# Patient Record
Sex: Male | Born: 1975 | Hispanic: Yes | Marital: Married | Smoking: Never smoker
Health system: Southern US, Community
[De-identification: ages and names within clinical notes are randomized; demographics above are authoritative.]

## PROBLEM LIST (undated history)

## (undated) DIAGNOSIS — K8020 Calculus of gallbladder without cholecystitis without obstruction: Secondary | ICD-10-CM

---

## 2011-09-20 ENCOUNTER — Emergency Department: Payer: Self-pay

## 2019-03-31 ENCOUNTER — Emergency Department: Payer: Medicaid Other

## 2019-03-31 ENCOUNTER — Observation Stay
Admission: EM | Admit: 2019-03-31 | Discharge: 2019-04-01 | Disposition: A | Discharge status: Home / Self Care | Payer: Medicaid Other | Attending: Surgery | Admitting: Surgery

## 2019-03-31 ENCOUNTER — Encounter
Admission: EM | Disposition: A | Discharge status: Home / Self Care | Payer: Self-pay | Source: Home / Self Care | Attending: Emergency Medicine

## 2019-03-31 ENCOUNTER — Observation Stay: Payer: Medicaid Other | Admitting: Anesthesiology

## 2019-03-31 ENCOUNTER — Other Ambulatory Visit: Payer: Self-pay

## 2019-03-31 ENCOUNTER — Encounter: Payer: Self-pay | Admitting: Emergency Medicine

## 2019-03-31 DIAGNOSIS — K658 Other peritonitis: Secondary | ICD-10-CM | POA: Insufficient documentation

## 2019-03-31 DIAGNOSIS — R1013 Epigastric pain: Secondary | ICD-10-CM

## 2019-03-31 DIAGNOSIS — K810 Acute cholecystitis: Secondary | ICD-10-CM | POA: Diagnosis present

## 2019-03-31 DIAGNOSIS — K660 Peritoneal adhesions (postprocedural) (postinfection): Secondary | ICD-10-CM | POA: Insufficient documentation

## 2019-03-31 DIAGNOSIS — Z1159 Encounter for screening for other viral diseases: Secondary | ICD-10-CM | POA: Diagnosis not present

## 2019-03-31 DIAGNOSIS — K8012 Calculus of gallbladder with acute and chronic cholecystitis without obstruction: Principal | ICD-10-CM | POA: Insufficient documentation

## 2019-03-31 DIAGNOSIS — R112 Nausea with vomiting, unspecified: Secondary | ICD-10-CM

## 2019-03-31 DIAGNOSIS — I10 Essential (primary) hypertension: Secondary | ICD-10-CM

## 2019-03-31 HISTORY — DX: Calculus of gallbladder without cholecystitis without obstruction: K80.20

## 2019-03-31 HISTORY — PX: CHOLECYSTECTOMY: SHX55

## 2019-03-31 LAB — COMPREHENSIVE METABOLIC PANEL
ALT: 41 U/L (ref 0–44)
AST: 29 U/L (ref 15–41)
Albumin: 4.4 g/dL (ref 3.5–5.0)
Alkaline Phosphatase: 117 U/L (ref 38–126)
Anion gap: 8 (ref 5–15)
BUN: 13 mg/dL (ref 6–20)
CO2: 28 mmol/L (ref 22–32)
Calcium: 8.9 mg/dL (ref 8.9–10.3)
Chloride: 104 mmol/L (ref 98–111)
Creatinine, Ser: 1.27 mg/dL — ABNORMAL HIGH (ref 0.61–1.24)
GFR calc Af Amer: >60 mL/min (ref >60)
GFR calc non Af Amer: >60 mL/min (ref >60)
Glucose, Bld: 129 mg/dL — ABNORMAL HIGH (ref 70–99)
Potassium: 3.5 mmol/L (ref 3.5–5.1)
Sodium: 140 mmol/L (ref 135–145)
Total Bilirubin: 0.7 mg/dL (ref 0.3–1.2)
Total Protein: 7.6 g/dL (ref 6.5–8.1)

## 2019-03-31 LAB — URINALYSIS, COMPLETE (UACMP) WITH MICROSCOPIC
Bacteria, UA: NONE SEEN
Bilirubin Urine: NEGATIVE
Glucose, UA: NEGATIVE mg/dL
Hgb urine dipstick: NEGATIVE
Ketones, ur: NEGATIVE mg/dL
Leukocytes,Ua: NEGATIVE
Nitrite: NEGATIVE
Protein, ur: NEGATIVE mg/dL
Specific Gravity, Urine: 1.015 (ref 1.005–1.030)
Squamous Epithelial / HPF: NONE SEEN (ref 0–5)
pH: 7 (ref 5.0–8.0)

## 2019-03-31 LAB — SARS CORONAVIRUS 2 BY RT PCR (HOSPITAL ORDER, PERFORMED IN ~~LOC~~ HOSPITAL LAB): SARS Coronavirus 2: NEGATIVE

## 2019-03-31 LAB — CBC
HCT: 46.5 % (ref 39.0–52.0)
Hemoglobin: 16.1 g/dL (ref 13.0–17.0)
MCH: 28.6 pg (ref 26.0–34.0)
MCHC: 34.6 g/dL (ref 30.0–36.0)
MCV: 82.7 fL (ref 80.0–100.0)
Platelets: 181 10*3/uL (ref 150–400)
RBC: 5.62 MIL/uL (ref 4.22–5.81)
RDW: 13.1 % (ref 11.5–15.5)
WBC: 9.4 10*3/uL (ref 4.0–10.5)
nRBC: 0 % (ref 0.0–0.2)

## 2019-03-31 LAB — LIPASE, BLOOD: Lipase: 45 U/L (ref 11–51)

## 2019-03-31 LAB — SURGICAL PCR SCREEN
MRSA, PCR: NEGATIVE
Staphylococcus aureus: NEGATIVE

## 2019-03-31 SURGERY — LAPAROSCOPIC CHOLECYSTECTOMY
Anesthesia: General

## 2019-03-31 MED ORDER — TRAMADOL HCL 50 MG PO TABS: 50.0000 mg | ORAL_TABLET | Freq: Four times a day (QID) | ORAL | Status: DC | PRN

## 2019-03-31 MED ORDER — ONDANSETRON HCL 4 MG/2ML IJ SOLN: 4.0000 mg | Freq: Four times a day (QID) | INTRAMUSCULAR | Status: DC | PRN

## 2019-03-31 MED ORDER — MEPERIDINE HCL 50 MG/ML IJ SOLN: 6.2500 mg | INTRAMUSCULAR | Status: DC | PRN

## 2019-03-31 MED ORDER — ACETAMINOPHEN 160 MG/5ML PO SOLN
325.0000 mg | ORAL | Status: DC | PRN
  Filled 2019-03-31: qty 10.2

## 2019-03-31 MED ORDER — DOCUSATE SODIUM 100 MG PO CAPS: 100.0000 mg | ORAL_CAPSULE | Freq: Two times a day (BID) | ORAL | Status: DC | PRN

## 2019-03-31 MED ORDER — OXYCODONE HCL 5 MG PO TABS: 5.0000 mg | ORAL_TABLET | Freq: Once | ORAL | Status: DC | PRN

## 2019-03-31 MED ORDER — MIDAZOLAM HCL 2 MG/2ML IJ SOLN
INTRAMUSCULAR | Status: DC | PRN
  Administered 2019-03-31: 2 mg via INTRAVENOUS

## 2019-03-31 MED ORDER — ACETAMINOPHEN 650 MG RE SUPP: 650.0000 mg | Freq: Four times a day (QID) | RECTAL | Status: DC | PRN

## 2019-03-31 MED ORDER — MORPHINE SULFATE (PF) 4 MG/ML IV SOLN
4.0000 mg | Freq: Once | INTRAVENOUS | Status: AC
  Administered 2019-03-31: 06:00:00 4 mg via INTRAVENOUS
  Filled 2019-03-31: qty 1

## 2019-03-31 MED ORDER — ONDANSETRON HCL 4 MG/2ML IJ SOLN
INTRAMUSCULAR | Status: AC
  Filled 2019-03-31: qty 2

## 2019-03-31 MED ORDER — ACETAMINOPHEN 325 MG PO TABS: 325.0000 mg | ORAL_TABLET | ORAL | Status: DC | PRN

## 2019-03-31 MED ORDER — MUPIROCIN 2 % EX OINT
1.0000 "application " | TOPICAL_OINTMENT | Freq: Two times a day (BID) | CUTANEOUS | Status: DC
  Administered 2019-03-31: 1 via NASAL
  Filled 2019-03-31: qty 22

## 2019-03-31 MED ORDER — ROCURONIUM BROMIDE 100 MG/10ML IV SOLN
INTRAVENOUS | Status: DC | PRN
  Administered 2019-03-31: 20 mg via INTRAVENOUS
  Administered 2019-03-31: 40 mg via INTRAVENOUS
  Administered 2019-03-31: 5 mg via INTRAVENOUS
  Administered 2019-03-31: 20 mg via INTRAVENOUS
  Administered 2019-03-31: 10 mg via INTRAVENOUS

## 2019-03-31 MED ORDER — LACTATED RINGERS IV SOLN
INTRAVENOUS | Status: DC
  Administered 2019-03-31 – 2019-04-01 (×3): via INTRAVENOUS

## 2019-03-31 MED ORDER — FENTANYL CITRATE (PF) 100 MCG/2ML IJ SOLN
INTRAMUSCULAR | Status: AC
  Filled 2019-03-31: qty 2

## 2019-03-31 MED ORDER — ROCURONIUM BROMIDE 50 MG/5ML IV SOLN
INTRAVENOUS | Status: AC
  Filled 2019-03-31: qty 1

## 2019-03-31 MED ORDER — ONDANSETRON 4 MG PO TBDP: 4.0000 mg | ORAL_TABLET | Freq: Four times a day (QID) | ORAL | Status: DC | PRN

## 2019-03-31 MED ORDER — ONDANSETRON HCL 4 MG/2ML IJ SOLN
4.0000 mg | INTRAMUSCULAR | Status: AC
  Administered 2019-03-31: 4 mg via INTRAVENOUS
  Filled 2019-03-31: qty 2

## 2019-03-31 MED ORDER — SODIUM CHLORIDE 0.9 % IV SOLN
2.0000 g | Freq: Every day | INTRAVENOUS | Status: DC
  Administered 2019-03-31: 2 g via INTRAVENOUS
  Filled 2019-03-31: qty 2
  Filled 2019-03-31: qty 20

## 2019-03-31 MED ORDER — LIDOCAINE HCL (PF) 2 % IJ SOLN
INTRAMUSCULAR | Status: AC
  Filled 2019-03-31: qty 10

## 2019-03-31 MED ORDER — ENOXAPARIN SODIUM 40 MG/0.4ML ~~LOC~~ SOLN
40.0000 mg | SUBCUTANEOUS | Status: DC
  Administered 2019-04-01: 40 mg via SUBCUTANEOUS
  Filled 2019-03-31: qty 0.4

## 2019-03-31 MED ORDER — MIDAZOLAM HCL 2 MG/2ML IJ SOLN
INTRAMUSCULAR | Status: AC
  Filled 2019-03-31: qty 2

## 2019-03-31 MED ORDER — FENTANYL CITRATE (PF) 100 MCG/2ML IJ SOLN: 25.0000 ug | INTRAMUSCULAR | Status: DC | PRN

## 2019-03-31 MED ORDER — PROPOFOL 10 MG/ML IV BOLUS
INTRAVENOUS | Status: DC | PRN
  Administered 2019-03-31: 200 mg via INTRAVENOUS

## 2019-03-31 MED ORDER — HYDROMORPHONE HCL 1 MG/ML IJ SOLN
1.0000 mg | INTRAMUSCULAR | Status: DC | PRN
  Administered 2019-03-31: 1 mg via INTRAVENOUS
  Filled 2019-03-31: qty 1

## 2019-03-31 MED ORDER — FENTANYL CITRATE (PF) 100 MCG/2ML IJ SOLN
INTRAMUSCULAR | Status: DC | PRN
  Administered 2019-03-31 (×2): 50 ug via INTRAVENOUS
  Administered 2019-03-31: 100 ug via INTRAVENOUS

## 2019-03-31 MED ORDER — IBUPROFEN 400 MG PO TABS: 600.0000 mg | ORAL_TABLET | Freq: Four times a day (QID) | ORAL | Status: DC | PRN

## 2019-03-31 MED ORDER — ACETAMINOPHEN 10 MG/ML IV SOLN
INTRAVENOUS | Status: AC
  Filled 2019-03-31: qty 100

## 2019-03-31 MED ORDER — HYDROCODONE-ACETAMINOPHEN 5-325 MG PO TABS
1.0000 | ORAL_TABLET | ORAL | Status: DC | PRN
  Administered 2019-03-31 – 2019-04-01 (×3): 2 via ORAL
  Filled 2019-03-31 (×3): qty 2

## 2019-03-31 MED ORDER — ACETAMINOPHEN 10 MG/ML IV SOLN
INTRAVENOUS | Status: DC | PRN
  Administered 2019-03-31: 1000 mg via INTRAVENOUS

## 2019-03-31 MED ORDER — ACETAMINOPHEN 10 MG/ML IV SOLN: 1000.0000 mg | Freq: Once | INTRAVENOUS | Status: DC | PRN

## 2019-03-31 MED ORDER — PROPOFOL 10 MG/ML IV BOLUS
INTRAVENOUS | Status: AC
  Filled 2019-03-31: qty 20

## 2019-03-31 MED ORDER — PHENYLEPHRINE HCL (PRESSORS) 10 MG/ML IV SOLN
INTRAVENOUS | Status: DC | PRN
  Administered 2019-03-31 (×2): 100 ug via INTRAVENOUS
  Administered 2019-03-31: 200 ug via INTRAVENOUS
  Administered 2019-03-31 (×3): 100 ug via INTRAVENOUS

## 2019-03-31 MED ORDER — ACETAMINOPHEN 325 MG PO TABS: 650.0000 mg | ORAL_TABLET | Freq: Four times a day (QID) | ORAL | Status: DC | PRN

## 2019-03-31 MED ORDER — SUGAMMADEX SODIUM 500 MG/5ML IV SOLN
INTRAVENOUS | Status: DC | PRN
  Administered 2019-03-31: 220 mg via INTRAVENOUS

## 2019-03-31 MED ORDER — HEMOSTATIC AGENTS (NO CHARGE) OPTIME
TOPICAL | Status: DC | PRN
  Administered 2019-03-31: 1 via TOPICAL

## 2019-03-31 MED ORDER — LIDOCAINE-EPINEPHRINE (PF) 1 %-1:200000 IJ SOLN
INTRAMUSCULAR | Status: DC | PRN
  Administered 2019-03-31: 30 mL

## 2019-03-31 MED ORDER — DEXAMETHASONE SODIUM PHOSPHATE 10 MG/ML IJ SOLN
INTRAMUSCULAR | Status: DC | PRN
  Administered 2019-03-31: 10 mg via INTRAVENOUS

## 2019-03-31 MED ORDER — OXYCODONE HCL 5 MG/5ML PO SOLN: 5.0000 mg | Freq: Once | ORAL | Status: DC | PRN

## 2019-03-31 MED ORDER — SUGAMMADEX SODIUM 500 MG/5ML IV SOLN
INTRAVENOUS | Status: AC
  Filled 2019-03-31: qty 5

## 2019-03-31 MED ORDER — ONDANSETRON HCL 4 MG/2ML IJ SOLN
INTRAMUSCULAR | Status: DC | PRN
  Administered 2019-03-31: 4 mg via INTRAVENOUS

## 2019-03-31 MED ORDER — PANTOPRAZOLE SODIUM 40 MG IV SOLR
40.0000 mg | Freq: Every day | INTRAVENOUS | Status: DC
  Administered 2019-03-31: 40 mg via INTRAVENOUS
  Filled 2019-03-31: qty 40

## 2019-03-31 MED ORDER — DEXAMETHASONE SODIUM PHOSPHATE 10 MG/ML IJ SOLN
INTRAMUSCULAR | Status: AC
  Filled 2019-03-31: qty 1

## 2019-03-31 MED ORDER — MORPHINE SULFATE (PF) 2 MG/ML IV SOLN
2.0000 mg | INTRAVENOUS | Status: DC | PRN
  Administered 2019-03-31: 2 mg via INTRAVENOUS
  Filled 2019-03-31: qty 1

## 2019-03-31 MED ORDER — LIDOCAINE HCL (CARDIAC) PF 100 MG/5ML IV SOSY
PREFILLED_SYRINGE | INTRAVENOUS | Status: DC | PRN
  Administered 2019-03-31: 80 mg via INTRAVENOUS

## 2019-03-31 MED ORDER — SODIUM CHLORIDE 0.9 % IV SOLN
INTRAVENOUS | Status: DC | PRN
  Administered 2019-03-31: 30 mL via INTRAVENOUS

## 2019-03-31 MED ORDER — PROMETHAZINE HCL 25 MG/ML IJ SOLN: 6.2500 mg | INTRAMUSCULAR | Status: DC | PRN

## 2019-03-31 SURGICAL SUPPLY — 59 items
ANCHOR TIS RET SYS 235ML (MISCELLANEOUS) ×1 IMPLANT
APPLICATOR ARISTA FLEXITIP XL (MISCELLANEOUS) ×2 IMPLANT
APPLIER CLIP 5 13 M/L LIGAMAX5 (MISCELLANEOUS) ×6
BLADE SURG SZ11 CARB STEEL (BLADE) ×3 IMPLANT
CANISTER SUCT 1200ML W/VALVE (MISCELLANEOUS) ×3 IMPLANT
CHLORAPREP W/TINT 26 (MISCELLANEOUS) ×3 IMPLANT
CHOLANGIOGRAM CATH TAUT (CATHETERS) IMPLANT
CLIP APPLIE 5 13 M/L LIGAMAX5 (MISCELLANEOUS) ×1 IMPLANT
COVER WAND RF STERILE (DRAPES) ×3 IMPLANT
DECANTER SPIKE VIAL GLASS SM (MISCELLANEOUS) ×6 IMPLANT
DEFOGGER SCOPE WARMER CLEARIFY (MISCELLANEOUS) ×3 IMPLANT
DERMABOND ADVANCED (GAUZE/BANDAGES/DRESSINGS) ×2
DERMABOND ADVANCED .7 DNX12 (GAUZE/BANDAGES/DRESSINGS) ×1 IMPLANT
DISSECTOR BLUNT TIP ENDO 5MM (MISCELLANEOUS) IMPLANT
DISSECTOR KITTNER STICK (MISCELLANEOUS) IMPLANT
DISSECTORS/KITTNER STICK (MISCELLANEOUS)
DRAPE 3/4 80X56 (DRAPES) IMPLANT
DRAPE C-ARM XRAY 36X54 (DRAPES) IMPLANT
ELECT CAUTERY BLADE 6.4 (BLADE) ×3 IMPLANT
ELECT REM PT RETURN 9FT ADLT (ELECTROSURGICAL) ×3
ELECTRODE REM PT RTRN 9FT ADLT (ELECTROSURGICAL) ×1 IMPLANT
GLOVE BIOGEL PI IND STRL 7.0 (GLOVE) ×1 IMPLANT
GLOVE BIOGEL PI INDICATOR 7.0 (GLOVE) ×4
GLOVE SURG SYN 6.5 ES PF (GLOVE) ×6 IMPLANT
GLOVE SURG SYN 6.5 PF PI (GLOVE) ×1 IMPLANT
GOWN STRL REUS W/ TWL LRG LVL3 (GOWN DISPOSABLE) ×3 IMPLANT
GOWN STRL REUS W/TWL LRG LVL3 (GOWN DISPOSABLE) ×10
GRASPER SUT TROCAR 14GX15 (MISCELLANEOUS) ×3 IMPLANT
HEMOSTAT ARISTA ABSORB 1G (HEMOSTASIS) ×2 IMPLANT
HEMOSTAT SURGICEL 2X3 (HEMOSTASIS) ×2 IMPLANT
IRRIGATION STRYKERFLOW (MISCELLANEOUS) IMPLANT
IRRIGATOR STRYKERFLOW (MISCELLANEOUS) ×3
IV CATH ANGIO 12GX3 LT BLUE (NEEDLE) IMPLANT
IV NS 1000ML (IV SOLUTION) ×2
IV NS 1000ML BAXH (IV SOLUTION) IMPLANT
JACKSON PRATT 10 (INSTRUMENTS) IMPLANT
L-HOOK LAP DISP 36CM (ELECTROSURGICAL) ×3
LABEL OR SOLS (LABEL) ×3 IMPLANT
LHOOK LAP DISP 36CM (ELECTROSURGICAL) ×1 IMPLANT
NEEDLE HYPO 22GX1.5 SAFETY (NEEDLE) ×3 IMPLANT
PACK LAP CHOLECYSTECTOMY (MISCELLANEOUS) ×3 IMPLANT
PENCIL ELECTRO HAND CTR (MISCELLANEOUS) ×3 IMPLANT
PORT ACCESS TROCAR AIRSEAL 5 (TROCAR) ×3 IMPLANT
SCISSORS METZENBAUM CVD 33 (INSTRUMENTS) ×3 IMPLANT
SET TRI-LUMEN FLTR TB AIRSEAL (TUBING) ×3 IMPLANT
SLEEVE ENDOPATH XCEL 5M (ENDOMECHANICALS) ×3 IMPLANT
SPONGE LAP 18X18 RF (DISPOSABLE) IMPLANT
STOPCOCK 4 WAY LG BORE MALE ST (IV SETS) IMPLANT
SUT MNCRL 4-0 (SUTURE) ×4
SUT MNCRL 4-0 27XMFL (SUTURE) ×2
SUT VIC AB 3-0 SH 27 (SUTURE) ×2
SUT VIC AB 3-0 SH 27X BRD (SUTURE) IMPLANT
SUT VICRYL 0 AB UR-6 (SUTURE) ×6 IMPLANT
SUT VICRYL 0 UR6 27IN ABS (SUTURE) ×2 IMPLANT
SUTURE MNCRL 4-0 27XMF (SUTURE) ×1 IMPLANT
SYR 20CC LL (SYRINGE) ×3 IMPLANT
TROCAR XCEL BLUNT TIP 100MML (ENDOMECHANICALS) ×3 IMPLANT
TROCAR XCEL NON-BLD 5MMX100MML (ENDOMECHANICALS) ×3 IMPLANT
WATER STERILE IRR 1000ML POUR (IV SOLUTION) ×3 IMPLANT

## 2019-03-31 NOTE — ED Notes (Signed)
Report to amber, rn.  

## 2019-03-31 NOTE — ED Notes (Signed)
Pt provided with urinal

## 2019-03-31 NOTE — Anesthesia Preprocedure Evaluation (Addendum)
Anesthesia Evaluation  Patient identified by MRN, date of birth, ID band Patient awake    Reviewed: Allergy & Precautions, H&P , NPO status , reviewed documented beta blocker date and time   Airway Mallampati: II   Neck ROM: full    Dental  (+) Teeth Intact   Pulmonary    Pulmonary exam normal        Cardiovascular  Rate:Bradycardia  SB on EKG   Neuro/Psych    GI/Hepatic   Endo/Other    Renal/GU      Musculoskeletal   Abdominal   Peds  Hematology   Anesthesia Other Findings Past Medical History: No date: Gallstones History reviewed. No pertinent surgical history. BMI    Body Mass Index: 30.81 kg/m     Reproductive/Obstetrics                            Anesthesia Physical Anesthesia Plan  ASA: II  Anesthesia Plan: General   Post-op Pain Management:    Induction: Intravenous  PONV Risk Score and Plan: Ondansetron and Treatment may vary due to age or medical condition  Airway Management Planned: Oral ETT  Additional Equipment:   Intra-op Plan:   Post-operative Plan: Extubation in OR  Informed Consent: I have reviewed the patients History and Physical, chart, labs and discussed the procedure including the risks, benefits and alternatives for the proposed anesthesia with the patient or authorized representative who has indicated his/her understanding and acceptance.     Dental Advisory Given  Plan Discussed with: CRNA  Anesthesia Plan Comments:         Anesthesia Quick Evaluation

## 2019-03-31 NOTE — Anesthesia Post-op Follow-up Note (Signed)
Anesthesia QCDR form completed.        

## 2019-03-31 NOTE — Transfer of Care (Signed)
Immediate Anesthesia Transfer of Care Note  Patient: Navy Belay  Procedure(s) Performed: LAPAROSCOPIC CHOLECYSTECTOMY (N/A )  Patient Location: PACU  Anesthesia Type:General  Level of Consciousness: awake, alert  and oriented  Airway & Oxygen Therapy: Patient Spontanous Breathing and Patient connected to face mask oxygen  Post-op Assessment: Report given to RN and Post -op Vital signs reviewed and stable  Post vital signs: Reviewed and stable  Last Vitals:  Vitals Value Taken Time  BP 107/48 03/31/19 1727  Temp    Pulse 60 03/31/19 1727  Resp 12 03/31/19 1727  SpO2 100 % 03/31/19 1727  Vitals shown include unvalidated device data.  Last Pain:  Vitals:   03/31/19 1727  TempSrc:   PainSc: 0-No pain         Complications: No apparent anesthesia complications

## 2019-03-31 NOTE — H&P (Signed)
Subjective:   CC: acute cholecystitis  HPI:  Steve Hunt is a 43 y.o. male who is consulted by [facility] for evaluation of above cc.  Symptoms were first noted several years ago. Pain is intermittent, often at night, located in epigastric/RUQ region.  Resolves on its own.  Antacids do not help.  Associated with occasional N/V, exacerbated by possibly spicy and/or fatty food.  This pain episode started last night, did not get any better so came to ED.  Has not been seen for this issue before.     Past Medical History: none reported  Past Surgical History: none reported   Family History: reviewed and not relevant to CC  Social History:  reports that he has never smoked. He has never used smokeless tobacco. No history on file for alcohol and drug.  Current Medications: none reported   Allergies:  Allergies as of 03/31/2019  . (No Known Allergies)    ROS:  General: Denies weight loss, weight gain, fatigue, fevers, chills, and night sweats. Eyes: Denies blurry vision, double vision, eye pain, itchy eyes, and tearing. Ears: Denies hearing loss, earache, and ringing in ears. Nose: Denies sinus pain, congestion, infections, runny nose, and nosebleeds. Mouth/throat: Denies hoarseness, sore throat, bleeding gums, and difficulty swallowing. Heart: Denies chest pain, palpitations, racing heart, irregular heartbeat, leg pain or swelling, and decreased activity tolerance. Respiratory: Denies breathing difficulty, shortness of breath, wheezing, cough, and sputum. GI: Denies change in appetite, constipation, diarrhea, and blood in stool. GU: Denies difficulty urinating, pain with urinating, urgency, frequency, blood in urine. Musculoskeletal: Denies joint stiffness, pain, swelling, muscle weakness. Skin: Denies rash, itching, mass, tumors, sores, and boils Neurologic: Denies headache, fainting, dizziness, seizures, numbness, and tingling. Psychiatric: Denies depression, anxiety,  difficulty sleeping, and memory loss. Endocrine: Denies heat or cold intolerance, and increased thirst or urination. Blood/lymph: Denies easy bruising, easy bruising, and swollen glands     Objective:     BP (!) 144/100   Pulse (!) 52   Temp 99.2 F (37.3 C)   Resp 14   Ht 6\' 2"  (1.88 m)   Wt 108.9 kg   SpO2 100%   BMI 30.81 kg/m    Constitutional :  alert, cooperative, appears stated age and no distress  Lymphatics/Throat:  no asymmetry, masses, or scars  Respiratory:  clear to auscultation bilaterally  Cardiovascular:  regular rate and rhythm  Gastrointestinal: soft, no guarding, but focal tenderness in RUQ.   Musculoskeletal: Steady movement  Skin: Cool and moist  Psychiatric: Normal affect, non-agitated, not confused       LABS:  CMP Latest Ref Rng & Units 03/31/2019  Glucose 70 - 99 mg/dL 129(H)  BUN 6 - 20 mg/dL 13  Creatinine 0.61 - 1.24 mg/dL 1.27(H)  Sodium 135 - 145 mmol/L 140  Potassium 3.5 - 5.1 mmol/L 3.5  Chloride 98 - 111 mmol/L 104  CO2 22 - 32 mmol/L 28  Calcium 8.9 - 10.3 mg/dL 8.9  Total Protein 6.5 - 8.1 g/dL 7.6  Total Bilirubin 0.3 - 1.2 mg/dL 0.7  Alkaline Phos 38 - 126 U/L 117  AST 15 - 41 U/L 29  ALT 0 - 44 U/L 41   CBC Latest Ref Rng & Units 03/31/2019  WBC 4.0 - 10.5 K/uL 9.4  Hemoglobin 13.0 - 17.0 g/dL 16.1  Hematocrit 39.0 - 52.0 % 46.5  Platelets 150 - 400 K/uL 181     RADS: CLINICAL DATA:  Upper abdominal pain.  Nausea vomiting.  EXAM: ULTRASOUND ABDOMEN  LIMITED RIGHT UPPER QUADRANT  COMPARISON:  No prior.  FINDINGS: Gallbladder:  Multiple gallstones with the largest measuring 1.9 cm. Non mobile 1.1 cm gallstone noted in the neck of the gallbladder. Gallbladder is nondistended. Gallbladder wall is slightly thickened at 3.7 mm. Cholecystitis cannot be excluded. Negative Murphy sign.  Common bile duct:  Diameter: 4.8 mm  Liver:  No focal lesion identified. Mild increased echogenicity. Fatty infiltration  cannot be excluded. Portal vein is patent on color Doppler imaging with normal direction of blood flow towards the liver.  IMPRESSION: 1. Multiple gallstones with the largest measuring 1.9 cm. Non mobile 1.1 cm gallstone noted in the neck of the gallbladder. Gallbladder is nondistended. Gallbladder wall is slightly thickened at 3.7 mm. Cholecystitis cannot be excluded. Negative Murphy sign. No biliary distention.  2. Slight increased echogenicity of the liver. Fatty infiltration cannot be excluded.   Electronically Signed   By: Maisie Fushomas  Register   On: 03/31/2019 07:33 Assessment:      Acute choelcystitis  Plan:      Discussed the risk of surgery including post-op infxn, seroma, biloma, chronic pain, poor-delayed wound healing, retained gallstone, conversion to open procedure, post-op SBO or ileus, and need for additional procedures to address said risks.  The risks of general anesthetic including MI, CVA, sudden death or even reaction to anesthetic medications also discussed. Alternatives include continued observation.  Benefits include possible symptom relief, prevention of complications including acute cholecystitis, pancreatitis.  Typical post operative recovery of 3-5 days rest, continued pain in area and incision sites, possible loose stools up to 4-6 weeks, also discussed.  The patient understands the risks, any and all questions were answered to the patient's satisfaction.  Will proceed with lap chole once time available.  Admit with IVF, abx in the meantime.

## 2019-03-31 NOTE — ED Notes (Signed)
ED TO INPATIENT HANDOFF REPORT  ED Nurse Name and Phone #: Joice Loftsamber [phone]  S Name/Age/Gender Steve Hunt 43 y.o. male Room/Bed: ED17A/ED17A  Code Status   Code Status: Not on file  Home/SNF/Other Home Patient oriented to: self, place, time and situation Is this baseline? Yes   Triage Complete: Triage complete  Chief Complaint abdominal Pain  Triage Note Patient reports mid upper abdominal pain that radiates across abdomen for several hours.   Allergies No Known Allergies  Level of Care/Admitting Diagnosis ED Disposition    ED Disposition Condition Comment   Admit  Hospital Area: [facility] [[id number]]  Level of Care: Med-Surg [16]  Covid Evaluation: Asymptomatic Screening Protocol (No Symptoms)  Diagnosis: Acute cholecystitis [575.0.ICD-9-CM]  Admitting Physician: Sung AmabileSAKAI, ISAMI [[id number]]  Attending Physician: Sung AmabileSAKAI, ISAMI [phone][[id number]]  PT Class (Do Not Modify): Observation [104]  PT Acc Code (Do Not Modify): Observation [10022]       B Medical/Surgery History History reviewed. No pertinent past medical history. History reviewed. No pertinent surgical history.   A IV Location/Drains/Wounds Patient Lines/Drains/Airways Status   Active Line/Drains/Airways    Name:   Placement date:   Placement time:   Site:   Days:   Peripheral IV 03/31/19 Left Antecubital   03/31/19    0546    Antecubital   less than 1          Intake/Output Last 24 hours No intake or output data in the 24 hours ending 03/31/19 0932  Labs/Imaging Results for orders placed or performed during the hospital encounter of 03/31/19 (from the past 48 hour(s))  Lipase, blood     Status: None   Collection Time: 03/31/19  2:33 AM  Result Value Ref Range   Lipase 45 11 - 51 U/L    Comment: Performed at [facility], [address] [place], [place] [address]  Comprehensive metabolic panel     Status: Abnormal   Collection Time: 03/31/19  2:33 AM  Result  Value Ref Range   Sodium 140 135 - 145 mmol/L   Potassium 3.5 3.5 - 5.1 mmol/L   Chloride 104 98 - 111 mmol/L   CO2 28 22 - 32 mmol/L   Glucose, Bld 129 (H) 70 - 99 mg/dL   BUN 13 6 - 20 mg/dL   Creatinine, Ser 9.621.27 (H) 0.61 - 1.24 mg/dL   Calcium 8.9 8.9 - 95.210.3 mg/dL   Total Protein 7.6 6.5 - 8.1 g/dL   Albumin 4.4 3.5 - 5.0 g/dL   AST 29 15 - 41 U/L   ALT 41 0 - 44 U/L   Alkaline Phosphatase 117 38 - 126 U/L   Total Bilirubin 0.7 0.3 - 1.2 mg/dL   GFR calc non Af Amer >60 >60 mL/min   GFR calc Af Amer >60 >60 mL/min   Anion gap 8 5 - 15    Comment: Performed at [facility], [address] [place], [place] [address]  CBC     Status: None   Collection Time: 03/31/19  2:33 AM  Result Value Ref Range   WBC 9.4 4.0 - 10.5 K/uL   RBC 5.62 4.22 - 5.81 MIL/uL   Hemoglobin 16.1 13.0 - 17.0 g/dL   HCT 44.046.5 10.239.0 - 72.552.0 %   MCV 82.7 80.0 - 100.0 fL   MCH 28.6 26.0 - 34.0 pg   MCHC 34.6 30.0 - 36.0 g/dL   RDW 36.613.1 44.011.5 - 34.715.5 %   Platelets 181 150 - 400 K/uL  nRBC 0.0 0.0 - 0.2 %    Comment: Performed at [facility], [address] [place], [place] [address]  Urinalysis, Complete w Microscopic     Status: Abnormal   Collection Time: 03/31/19  2:33 AM  Result Value Ref Range   Color, Urine YELLOW (A) YELLOW   APPearance CLEAR (A) CLEAR   Specific Gravity, Urine 1.015 1.005 - 1.030   pH 7.0 5.0 - 8.0   Glucose, UA NEGATIVE NEGATIVE mg/dL   Hgb urine dipstick NEGATIVE NEGATIVE   Bilirubin Urine NEGATIVE NEGATIVE   Ketones, ur NEGATIVE NEGATIVE mg/dL   Protein, ur NEGATIVE NEGATIVE mg/dL   Nitrite NEGATIVE NEGATIVE   Leukocytes,Ua NEGATIVE NEGATIVE   WBC, UA 0-5 0 - 5 WBC/hpf   Bacteria, UA NONE SEEN NONE SEEN   Squamous Epithelial / LPF NONE SEEN 0 - 5    Comment: Performed at [facility], [address], [place], [place] [address]   US Aorta  Result Date: 03/31/2019 CLINICAL DATA:  Upper abdominal pain.  Hypertension. EXAM: ULTRASOUND OF  ABDOMINAL AORTA TECHNIQUE: Ultrasound examination of the abdominal aorta and proximal common iliac arteries was performed to evaluate for aneurysm. Additional color and Doppler images of the distal aorta were obtained to document patency. COMPARISON:  No prior. FINDINGS: Abdominal aortic measurements as follows: Proximal:  2.5 cm cm Mid:  2.2 cm cm Distal:  2.0 cm cm Patent: Yes, peak systolic velocity is 86.5 cm/s Right common iliac artery: 1.5 cm cm Left common iliac artery: 1.4 cm cm IMPRESSION: 1.  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 Ectatic abdominal aorta at risk for aneurysm development. Recommend followup by ultrasound in 5 years. This recommendation follows ACR consensus guidelines: White Paper of the ACR Incidental Findings Committee II on Vascular Findings. J Am Coll Radiol 2013; 10:789-794. Aortic aneurysm NOS (ICD10-I71.9) 2. Ectasia/aneurysmal dilatation both common iliac arteries with the right common iliac artery measuring 1.5 cm and the left 1.4 cm. Vascular surgery consultation suggested. Electronically Signed   By: Marcello Moores  Register   On: 03/31/2019 07:35   US Abdomen Limited Ruq  Result Date: 03/31/2019 CLINICAL DATA:  Upper abdominal pain.  Nausea vomiting. EXAM: ULTRASOUND ABDOMEN LIMITED RIGHT UPPER QUADRANT COMPARISON:  No prior. FINDINGS: Gallbladder: Multiple gallstones with the largest measuring 1.9 cm. Non mobile 1.1 cm gallstone noted in the neck of the gallbladder. Gallbladder is nondistended. Gallbladder wall is slightly thickened at 3.7 mm. Cholecystitis cannot be excluded. Negative Murphy sign. Common bile duct: Diameter: 4.8 mm Liver: No focal lesion identified. Mild increased echogenicity. Fatty infiltration cannot be excluded. Portal vein is patent on color Doppler imaging with normal direction of blood flow towards the liver. IMPRESSION: 1. Multiple gallstones with the largest measuring 1.9 cm. Non mobile 1.1 cm gallstone noted in the neck of the gallbladder. Gallbladder is  nondistended. Gallbladder wall is slightly thickened at 3.7 mm. Cholecystitis cannot be excluded. Negative Murphy sign. No biliary distention. 2. Slight increased echogenicity of the liver. Fatty infiltration cannot be excluded. Electronically Signed   By: Marcello Moores  Register   On: 03/31/2019 07:33    Pending Labs Unresulted Labs (From admission, onward)    Start     Ordered   03/31/19 0822  SARS Coronavirus 2 (CEPHEID - Performed in [place] hospital lab), [place]  (Asymptomatic Patients Labs)  Once,   STAT    Question:  Rule Out  Answer:  Yes   03/31/19 0821   Signed and Held  HIV antibody (Routine Testing)  Once,   R     Signed and Held   Signed and Held  Basic metabolic panel  Daily,   R     Signed and Held   Signed and Held  Magnesium  Daily,   R     Signed and Held   Signed and Held  Phosphorus  Daily,   R     Signed and Held   Signed and Held  CBC  Daily,   R     Signed and Held   Signed and Held  Hepatic function panel  Daily,   R     Signed and Held          Vitals/Pain Today's Vitals   03/31/19 0615 03/31/19 0624 03/31/19 0630 03/31/19 0927  BP: (!) 162/102  (!) 144/100   Pulse:      Resp: 12  14   Temp:      SpO2:      Weight:      Height:      PainSc:  3   3     Isolation Precautions No active isolations  Medications Medications  morphine 4 MG/ML injection 4 mg (4 mg Intravenous Given 03/31/19 0548)  ondansetron (ZOFRAN) injection 4 mg (4 mg Intravenous Given 03/31/19 0549)    Mobility walks Low fall risk   Focused Assessments    R Recommendations: See Admitting Provider Note  Report given to:   Additional Notes:

## 2019-03-31 NOTE — Anesthesia Postprocedure Evaluation (Signed)
Anesthesia Post Note  Patient: Steve Hunt  Procedure(s) Performed: LAPAROSCOPIC CHOLECYSTECTOMY (N/A )  Patient location during evaluation: PACU Anesthesia Type: General Level of consciousness: awake and alert Pain management: pain level controlled Vital Signs Assessment: post-procedure vital signs reviewed and stable Respiratory status: spontaneous breathing and respiratory function stable Cardiovascular status: stable Anesthetic complications: no     Last Vitals:  Vitals:   03/31/19 1810 03/31/19 1829  BP: 135/88 128/84  Pulse: 63 66  Resp: 12 16  Temp:  36.8 C  SpO2: 98% 97%    Last Pain:  Vitals:   03/31/19 1829  TempSrc: Oral  PainSc: 0-No pain                 KEPHART,WILLIAM K

## 2019-03-31 NOTE — Op Note (Signed)
Preoperative diagnosis:  acute and cholecystitis  Postoperative diagnosis: same as above  Procedure: Laparoscopic Cholecystectomy.   Anesthesia: GETA   Surgeon: Benjamine Sprague  Specimen: Gallbladder  Complications: None  EBL: 236mL  Wound Classification: Clean Contaminated  Indications: see HPI  Findings: Critical view of safety noted Cystic duct and artery identified, ligated and divided, clips remained intact at end of procedure Adequate hemostasis  Description of procedure: The patient was placed on the operating table in the supine position. SCDs placed, pre-op abx administered.  General anesthesia was induced and OG tube placed by anesthesia. A time-out was completed verifying correct patient, procedure, site, positioning, and implant(s) and/or special equipment prior to beginning this procedure. The abdomen was prepped and draped in the usual sterile fashion.  An incision was made in a natural skin line below the umbilicus.  Dissection carried down to fascia where two 0 vicryl sutures placed to use as anchor sutures for hasson port.  Incision made into fascia and blunt dissection used to enter peritoneum.  Hasson port placed and insufflation started up to 72mm Hg without any dramatic increase in pressure.    The laparoscope was inserted and the abdomen inspected. No injuries from initial trocar placement were noted. Additional trocars were then inserted under direct visualization in the following locations: a 5-mm trocar in the subxyphoid region and two 5-mm trocars along the right costal margin. The abdomen was inspected and no abnormalities or injuries were found. The table was placed in the reverse Trendelenburg position with the right side up.  Dense adhesions between the gallbladder and omentum, duodenum and transverse colon were lysed sharply. Gallbladder contents drained with needle prior to retraction due to very thick and tense wall.  The dome of the gallbladder was grasped  with an atraumatic grasper passed through the lateral port and retracted over the dome of the liver. The infundibulum was also grasped with an atraumatic grasper and retracted toward the right lower quadrant. This maneuver exposed Calot's triangle. The peritoneum overlying the gallbladder infundibulum was then meticulously dissected and the cystic duct and cystic artery identified.  Critical view of safety with the liver bed clearly visible behind the duct and artery with no additional structures noted.    The gallbladder was then dissected from its peritoneal and liver bed attachments by electrocautery. One very large arterial bleeder was noted with the pertioneal attachment within the fossa, which had to be controlled with multiple clips.  Hemostasis was checked and the gallbladder was removed using an endoscopic retrieval bag placed through the umbilical port. The gallbladder was passed off the table as a specimen. The gallbladder fossa was copiously irrigated with saline and any leaked bile was suctioned out, and hemostasis was obtained. There was no evidence of bleeding from the gallbladder fossa or cystic artery or leakage of the bile from the cystic duct stump. The umbilical trocar removed and port site closed 0 vicryl under direct vision.  Abdomen desufflated and secondary trocars were removed under direct vision. No bleeding was noted.  Umbilical wound irrigated and 3-0 vicryl used to close deep dermal layer at umbilical site.  All skin incisions then closed with subcuticular sutures of 4-0 monocryl and dressed with topical skin adhesive. The orogastric tube was removed and patient extubated. The patient tolerated the procedure well and was taken to the postanesthesia care unit in stable condition.  All sponge and instrument count correct at end of procedure.

## 2019-03-31 NOTE — ED Triage Notes (Signed)
Patient reports mid upper abdominal pain that radiates across abdomen for several hours.

## 2019-03-31 NOTE — Anesthesia Procedure Notes (Signed)
Procedure Name: Intubation Date/Time: 03/31/2019 2:48 PM Performed by: Jonna Clark, CRNA Pre-anesthesia Checklist: Patient identified, Patient being monitored, Timeout performed, Emergency Drugs available and Suction available Patient Re-evaluated:Patient Re-evaluated prior to induction Oxygen Delivery Method: Circle system utilized Preoxygenation: Pre-oxygenation with 100% oxygen Induction Type: IV induction Ventilation: Mask ventilation without difficulty Laryngoscope Size: Mac and 4 Grade View: Grade II Tube type: Oral Tube size: 7.5 mm Number of attempts: 1 Placement Confirmation: ETT inserted through vocal cords under direct vision,  positive ETCO2 and breath sounds checked- equal and bilateral Secured at: 22 cm Tube secured with: Tape Dental Injury: Teeth and Oropharynx as per pre-operative assessment

## 2019-03-31 NOTE — ED Notes (Signed)
Ultrasound in progress  

## 2019-03-31 NOTE — ED Provider Notes (Signed)
[facility] Emergency Department Provider Note  ____________________________________________   First MD Initiated Contact with Patient 03/31/19 [phone]     (approximate)  I have reviewed the triage vital signs and the nursing notes.   HISTORY  Chief Complaint Abdominal Pain    HPI Steve Steve Hunt Steve Hunt is a 43 y.o. male who denies any chronic medical issues including high blood pressure who presents for evaluation  of acute onset severe pain in the upper middle part of his abdomen that radiates around to the back on both sides.  He says this has happened multiple times in the past but it is always been minor and short in duration.  Tonight it started about 1:00 AM and it persisted for several hours before he came to the ED.  It is accompanied with nausea and vomiting.  Nothing particular makes it better or worse.  He last ate about 6 hours prior to the onset of pain.  He denies any contact with COVID-19 patients and he denies fever, sore throat, cough, chest pain, shortness of breath.  He has had no numbness nor tingling in his extremities.  He has not ever been diagnosed with high blood pressure or taken blood pressure medicine.  He describes the pain as constant and sharp.        History reviewed. No pertinent past medical history.  There are no active problems to display for this patient.   History reviewed. No pertinent surgical history.  Prior to Admission medications   Not on File    Allergies Patient has no known allergies.  History reviewed. No pertinent family history.  Social History Social History   Tobacco Use  . Smoking status: Never Smoker  . Smokeless tobacco: Never Used  Substance Use Topics  . Alcohol use: Not on file  . Drug use: Not on file    Review of Systems Constitutional: No fever/chills Eyes: No visual changes. ENT: No sore throat. Cardiovascular: Denies chest pain. Respiratory: Denies shortness of breath.  Gastrointestinal: Severe pain in the middle of his upper abdomen as described above.  Associated nausea and vomiting. Genitourinary: Negative for dysuria. Musculoskeletal: Negative for neck pain.  Negative for back pain. Integumentary: Negative for rash. Neurological: Negative for headaches, focal weakness or numbness.   ____________________________________________   PHYSICAL EXAM:  VITAL SIGNS: ED Triage Vitals  Enc Vitals Group     BP 03/31/19 0226 (!) 199/107     Pulse Rate 03/31/19 0226 (!) 52     Resp 03/31/19 0226 19     Temp 03/31/19 0226 99.2 F (37.3 C)     Temp src --      SpO2 03/31/19 0226 100 %     Weight 03/31/19 0224 108.9 kg (240 lb)     Height 03/31/19 0224 1.88 m (6\' 2" )     Head Circumference --      Peak Flow --      Pain Score 03/31/19 0224 10     Pain Loc --      Pain Edu? --      Excl. in [place]? --     Constitutional: Alert and oriented. Appears uncomfortable. Eyes: Conjunctivae are normal.  Head: Atraumatic. Nose: No congestion/rhinnorhea. Mouth/Throat: Mucous membranes are moist. Neck: No stridor.  No meningeal signs.   Cardiovascular: Normal rate, regular rhythm. Good peripheral circulation. Grossly normal heart sounds. Respiratory: Normal respiratory effort.  No retractions. No audible wheezing. Gastrointestinal: Abdomen is somewhat tense but this seems to be from the  patient guarding.  He has severe tenderness to palpation of the epigastrium and right upper quadrant with positive Murphy sign.  He guards when I am palpating his lower abdomen but not as much and it does not seem to elicit as much tenderness.  No palpable or audible bruit. Musculoskeletal: No lower extremity tenderness nor edema. No gross deformities of extremities. Neurologic:  Normal speech and language. No gross focal neurologic deficits are appreciated.  Skin:  Skin is warm, dry and intact. No rash noted.   ____________________________________________   LABS (all labs  ordered are listed, but only abnormal results are displayed)  Labs Reviewed  COMPREHENSIVE METABOLIC PANEL - Abnormal; Notable for the following components:      Result Value   Glucose, Bld 129 (*)    Creatinine, Ser 1.27 (*)    All other components within normal limits  URINALYSIS, COMPLETE (UACMP) WITH MICROSCOPIC - Abnormal; Notable for the following components:   Color, Urine YELLOW (*)    APPearance CLEAR (*)    All other components within normal limits  LIPASE, BLOOD  CBC   ____________________________________________  EKG  ED ECG REPORT I, Loleta Roseory Nickisha Hum, the attending physician, personally viewed and interpreted this ECG.  Date: 03/31/2019 EKG Time: 2:28 AM Rate: 47 Rhythm: Sinus bradycardia QRS Axis: normal Intervals: normal ST/T Wave abnormalities: normal Narrative Interpretation: no evidence of acute ischemia  ____________________________________________  RADIOLOGY   ED MD interpretation:  U/S pending  Official radiology report(s): No results found.  ____________________________________________   PROCEDURES   Procedure(s) performed (including Critical Care):  Procedures   ____________________________________________   INITIAL IMPRESSION / MDM / ASSESSMENT AND PLAN / ED COURSE  As part of my medical decision making, I reviewed the following data within the electronic MEDICAL RECORD NUMBER Nursing notes reviewed and incorporated, Labs reviewed , EKG interpreted , Old chart reviewed, Patient signed out to Dr. Lenard LancePaduchowski and Notes from prior ED visits   Differential diagnosis includes, but is not limited to, biliary colic or cholecystitis, pancreatitis, AAA, aortic dissection, ACS.  The patient gives a clear history that this is happened multiple times in the past but just not been so severe or persistent.  I strongly suspect biliary colic and he likely has a stone stuck in the neck of his gallbladder.  His lab work is back and notable for normal LFTs and  a normal lipase.  Creatinine is slightly elevated at 1.27 but this may be his baseline.  No leukocytosis.  Urinalysis is normal.  I think that gallbladder disease is by far the most likely diagnosis and I have ordered an ultrasound.  However I am concerned about his blood pressure and his description of the pain radiating through into his back.  I think it is worthwhile to go ahead and get an ultrasound of his aorta while they are obtaining the right upper quadrant ultrasound.  If his right upper quadrant ultrasound is completely normal and/or if there are any abnormalities on the aorta ultrasound, I could consider getting a CTA of the chest.  I am giving morphine 4 mg IV and Zofran 4 mg IV.  The patient understands and agrees with the plan and will remain n.p.o.      Clinical Course as of Mar 30 708  Mon Mar 31, 2019  0708 Transferring ED care to Dr. Lenard LancePaduchowski to follow up ultrasound and reassess.  Anticipate surgery consult if pain persists.   [CF]    Clinical Course User Index [CF] Loleta RoseForbach, Novia Lansberry, MD  ____________________________________________  FINAL CLINICAL IMPRESSION(S) / ED DIAGNOSES  Final diagnoses:  Epigastric abdominal pain  Nausea and vomiting, intractability of vomiting not specified, unspecified vomiting type  Uncontrolled hypertension     MEDICATIONS GIVEN DURING THIS VISIT:  Medications  morphine 4 MG/ML injection 4 mg (4 mg Intravenous Given 03/31/19 0548)  ondansetron (ZOFRAN) injection 4 mg (4 mg Intravenous Given 03/31/19 0549)     ED Discharge Orders    None      *Please note:  Steve Steve Hunt was evaluated in Emergency Department on 03/31/2019 for the symptoms described in the history of present illness. He was evaluated in the context of the global COVID-19 pandemic, which necessitated consideration that the patient might be at risk for infection with the SARS-CoV-2 virus that causes COVID-19. Institutional protocols and algorithms that  pertain to the evaluation of patients at risk for COVID-19 are in a state of rapid change based on information released by regulatory bodies including the CDC and federal and state organizations. These policies and algorithms were followed during the patient's care in the ED.  Some ED evaluations and interventions may be delayed as a result of limited staffing during the pandemic.*  Note:  This document was prepared using Dragon voice recognition software and may include unintentional dictation errors.   Loleta RoseForbach, Byron Peacock, MD 03/31/19 [phone]

## 2019-04-01 LAB — CBC
HCT: 43.2 % (ref 39.0–52.0)
Hemoglobin: 14.7 g/dL (ref 13.0–17.0)
MCH: 28.5 pg (ref 26.0–34.0)
MCHC: 34 g/dL (ref 30.0–36.0)
MCV: 83.7 fL (ref 80.0–100.0)
Platelets: 182 10*3/uL (ref 150–400)
RBC: 5.16 MIL/uL (ref 4.22–5.81)
RDW: 13.1 % (ref 11.5–15.5)
WBC: 12.9 10*3/uL — ABNORMAL HIGH (ref 4.0–10.5)
nRBC: 0 % (ref 0.0–0.2)

## 2019-04-01 LAB — BASIC METABOLIC PANEL
Anion gap: 7 (ref 5–15)
BUN: 11 mg/dL (ref 6–20)
CO2: 24 mmol/L (ref 22–32)
Calcium: 8.6 mg/dL — ABNORMAL LOW (ref 8.9–10.3)
Chloride: 106 mmol/L (ref 98–111)
Creatinine, Ser: 0.94 mg/dL (ref 0.61–1.24)
GFR calc Af Amer: >60 mL/min (ref >60)
GFR calc non Af Amer: >60 mL/min (ref >60)
Glucose, Bld: 154 mg/dL — ABNORMAL HIGH (ref 70–99)
Potassium: 4.3 mmol/L (ref 3.5–5.1)
Sodium: 137 mmol/L (ref 135–145)

## 2019-04-01 LAB — MAGNESIUM: Magnesium: 2.2 mg/dL (ref 1.7–2.4)

## 2019-04-01 LAB — HEPATIC FUNCTION PANEL
ALT: 64 U/L — ABNORMAL HIGH (ref 0–44)
AST: 50 U/L — ABNORMAL HIGH (ref 15–41)
Albumin: 3.9 g/dL (ref 3.5–5.0)
Alkaline Phosphatase: 87 U/L (ref 38–126)
Bilirubin, Direct: 0.2 mg/dL (ref 0.0–0.2)
Indirect Bilirubin: 1 mg/dL — ABNORMAL HIGH (ref 0.3–0.9)
Total Bilirubin: 1.2 mg/dL (ref 0.3–1.2)
Total Protein: 7.1 g/dL (ref 6.5–8.1)

## 2019-04-01 LAB — PHOSPHORUS: Phosphorus: 2 mg/dL — ABNORMAL LOW (ref 2.5–4.6)

## 2019-04-01 MED ORDER — IBUPROFEN 800 MG PO TABS: 800.0000 mg | ORAL_TABLET | Freq: Three times a day (TID) | ORAL | 0 refills | Status: DC | PRN

## 2019-04-01 MED ORDER — ACETAMINOPHEN 325 MG PO TABS: 650.0000 mg | ORAL_TABLET | Freq: Three times a day (TID) | ORAL | 0 refills | Status: AC | PRN

## 2019-04-01 MED ORDER — HYDROCODONE-ACETAMINOPHEN 5-325 MG PO TABS: 1.0000 | ORAL_TABLET | Freq: Four times a day (QID) | ORAL | 0 refills | Status: AC | PRN

## 2019-04-01 MED ORDER — DOCUSATE SODIUM 100 MG PO CAPS: 100.0000 mg | ORAL_CAPSULE | Freq: Two times a day (BID) | ORAL | 0 refills | Status: AC | PRN

## 2019-04-01 NOTE — Progress Notes (Signed)
MD ordered patient to be discharged home.  Discharge instructions were reviewed with the patient and he voiced understanding.  Follow-up appointment was made.  Prescriptions sent to the patients pharmacy.  IV was removed with catheter intact.  All patients questions were answered.  Patient waiting on his ride.

## 2019-04-01 NOTE — Discharge Instructions (Signed)
Laparoscopic Cholecystectomy, Care After This sheet gives you information about how to care for yourself after your procedure. Your doctor may also give you more specific instructions. If you have problems or questions, contact your doctor. Follow these instructions at home: Care for cuts from surgery (incisions)   Follow instructions from your doctor about how to take care of your cuts from surgery. Make sure you: ? Wash your hands with soap and water before you change your bandage (dressing). If you cannot use soap and water, use hand sanitizer. ? Change your bandage as told by your doctor. ? Leave stitches (sutures), skin glue, or skin tape (adhesive) strips in place. They may need to stay in place for 2 weeks or longer. If tape strips get loose and curl up, you may trim the loose edges. Do not remove tape strips completely unless your doctor says it is okay.  Do not take baths, swim, or use a hot tub until your doctor says it is okay. OK TO SHOWER 24HRS AFTER YOUR SURGERY.   Check your surgical cut area every day for signs of infection. Check for: ? More redness, swelling, or pain. ? More fluid or blood. ? Warmth. ? Pus or a bad smell. Activity  Do not drive or use heavy machinery while taking prescription pain medicine.  Do not play contact sports until your doctor says it is okay.  Do not drive for 24 hours if you were given a medicine to help you relax (sedative).  Rest as needed. Do not return to work or school until your doctor says it is okay. General instructions .  tylenol and advil as needed for discomfort.  Please alternate between the two every four hours as needed for pain.   .  Use narcotics, if prescribed, only when tylenol and motrin is not enough to control pain. .  325-650mg every 8hrs to max of 3000mg/24hrs (including the 325mg in every norco dose) for the tylenol.   .  Advil up to 800mg per dose every 8hrs as needed for pain.    To prevent or treat constipation  while you are taking prescription pain medicine, your doctor may recommend that you: ? Drink enough fluid to keep your pee (urine) clear or pale yellow. ? Take over-the-counter or prescription medicines. ? Eat foods that are high in fiber, such as fresh fruits and vegetables, whole grains, and beans. ? Limit foods that are high in fat and processed sugars, such as fried and sweet foods. Contact a doctor if:  You develop a rash.  You have more redness, swelling, or pain around your surgical cuts.  You have more fluid or blood coming from your surgical cuts.  Your surgical cuts feel warm to the touch.  You have pus or a bad smell coming from your surgical cuts.  You have a fever.  One or more of your surgical cuts breaks open. Get help right away if:  You have trouble breathing.  You have chest pain.  You have pain that is getting worse in your shoulders.  You faint or feel dizzy when you stand.  You have very bad pain in your belly (abdomen).  You are sick to your stomach (nauseous) for more than one day.  You have throwing up (vomiting) that lasts for more than one day.  You have leg pain. This information is not intended to replace advice given to you by your health care provider. Make sure you discuss any questions you have with your   health care provider. Document Released: 06/06/2008 Document Revised: 03/18/2016 Document Reviewed: 02/14/2016 Elsevier Interactive Patient Education  2019 Elsevier Inc.   

## 2019-04-01 NOTE — Discharge Summary (Signed)
Physician Discharge Summary  Patient ID: Steve Hunt MRN: [id number] DOB/AGE: Feb 27, 1976 43 y.o.  Admit date: 03/31/2019 Discharge date: 04/01/2019  Admission Diagnoses: acute cholecystitis  Discharge Diagnoses:  Same as above  Discharged Condition: good  Hospital Course: admitted for above, underwent lap chole.  See op note.  Recovered without any issues.  Tolerating diet, pain controlled at time of d/c  Consults: None  Discharge Exam: Blood pressure 106/80, pulse 71, temperature 97.7 F (36.5 C), temperature source Oral, resp. rate 20, height 6\' 2"  (1.88 m), weight 108.9 kg, SpO2 97 %. General appearance: alert, cooperative and no distress GI: soft, no guarding, tender over RUQ, but more incisional   Disposition:  Discharge disposition: 01-Home or Self Care       Discharge Instructions    Discharge patient   Complete by: As directed    Discharge disposition: 01-Home or Self Care   Discharge patient date: 04/01/2019     Allergies as of 04/01/2019   No Known Allergies     Medication List    TAKE these medications   acetaminophen 325 MG tablet Commonly known as: Tylenol Take 2 tablets (650 mg total) by mouth every 8 (eight) hours as needed for mild pain.   docusate sodium 100 MG capsule Commonly known as: Colace Take 1 capsule (100 mg total) by mouth 2 (two) times daily as needed for up to 10 days for mild constipation.   HYDROcodone-acetaminophen 5-325 MG tablet Commonly known as: Norco Take 1 tablet by mouth every 6 (six) hours as needed for up to 3 days for moderate pain.   ibuprofen 800 MG tablet Commonly known as: ADVIL Take 1 tablet (800 mg total) by mouth every 8 (eight) hours as needed for mild pain or moderate pain.      Follow-up Information    Lysle Pearl, Joleen Stuckert, DO Follow up in 2 week(s).   Specialty: Surgery Why: wound check Contact information: [address] [place] [address] [phone]            Total time spent  arranging discharge was >31min. Signed: Benjamine Sprague 04/01/2019, 11:02 AM

## 2019-04-02 LAB — HIV ANTIBODY (ROUTINE TESTING W REFLEX): HIV Screen 4th Generation wRfx: NONREACTIVE

## 2019-04-02 LAB — SURGICAL PATHOLOGY

## 2019-05-19 ENCOUNTER — Emergency Department: Payer: Medicaid Other

## 2019-05-19 ENCOUNTER — Other Ambulatory Visit: Payer: Self-pay

## 2019-05-19 ENCOUNTER — Inpatient Hospital Stay
Admission: EM | Admit: 2019-05-19 | Discharge: 2019-05-24 | DRG: 438 | Disposition: A | Discharge status: Home / Self Care | Payer: Medicaid Other | Attending: Internal Medicine | Admitting: Internal Medicine

## 2019-05-19 ENCOUNTER — Encounter: Payer: Self-pay | Admitting: Emergency Medicine

## 2019-05-19 DIAGNOSIS — F1010 Alcohol abuse, uncomplicated: Secondary | ICD-10-CM | POA: Diagnosis present

## 2019-05-19 DIAGNOSIS — E669 Obesity, unspecified: Secondary | ICD-10-CM | POA: Diagnosis present

## 2019-05-19 DIAGNOSIS — Z20828 Contact with and (suspected) exposure to other viral communicable diseases: Secondary | ICD-10-CM | POA: Diagnosis present

## 2019-05-19 DIAGNOSIS — Z6830 Body mass index (BMI) 30.0-30.9, adult: Secondary | ICD-10-CM | POA: Diagnosis not present

## 2019-05-19 DIAGNOSIS — R509 Fever, unspecified: Secondary | ICD-10-CM

## 2019-05-19 DIAGNOSIS — J189 Pneumonia, unspecified organism: Secondary | ICD-10-CM | POA: Diagnosis present

## 2019-05-19 DIAGNOSIS — K852 Alcohol induced acute pancreatitis without necrosis or infection: Principal | ICD-10-CM | POA: Diagnosis present

## 2019-05-19 DIAGNOSIS — Z9049 Acquired absence of other specified parts of digestive tract: Secondary | ICD-10-CM | POA: Diagnosis not present

## 2019-05-19 DIAGNOSIS — K859 Acute pancreatitis without necrosis or infection, unspecified: Secondary | ICD-10-CM | POA: Diagnosis not present

## 2019-05-19 LAB — URINALYSIS, COMPLETE (UACMP) WITH MICROSCOPIC
Bacteria, UA: NONE SEEN
Bilirubin Urine: NEGATIVE
Glucose, UA: NEGATIVE mg/dL
Hgb urine dipstick: NEGATIVE
Ketones, ur: NEGATIVE mg/dL
Leukocytes,Ua: NEGATIVE
Nitrite: NEGATIVE
Protein, ur: 30 mg/dL — AB
Specific Gravity, Urine: 1.012 (ref 1.005–1.030)
Squamous Epithelial / HPF: NONE SEEN (ref 0–5)
pH: 5 (ref 5.0–8.0)

## 2019-05-19 LAB — COMPREHENSIVE METABOLIC PANEL
ALT: 160 U/L — ABNORMAL HIGH (ref 0–44)
AST: 297 U/L — ABNORMAL HIGH (ref 15–41)
Albumin: 4.6 g/dL (ref 3.5–5.0)
Alkaline Phosphatase: 129 U/L — ABNORMAL HIGH (ref 38–126)
Anion gap: 12 (ref 5–15)
BUN: 14 mg/dL (ref 6–20)
CO2: 26 mmol/L (ref 22–32)
Calcium: 9.2 mg/dL (ref 8.9–10.3)
Chloride: 103 mmol/L (ref 98–111)
Creatinine, Ser: 1.04 mg/dL (ref 0.61–1.24)
GFR calc Af Amer: >60 mL/min (ref >60)
GFR calc non Af Amer: >60 mL/min (ref >60)
Glucose, Bld: 154 mg/dL — ABNORMAL HIGH (ref 70–99)
Potassium: 3.5 mmol/L (ref 3.5–5.1)
Sodium: 141 mmol/L (ref 135–145)
Total Bilirubin: 1.3 mg/dL — ABNORMAL HIGH (ref 0.3–1.2)
Total Protein: 7.9 g/dL (ref 6.5–8.1)

## 2019-05-19 LAB — CBC
HCT: 48.8 % (ref 39.0–52.0)
Hemoglobin: 16.8 g/dL (ref 13.0–17.0)
MCH: 28.8 pg (ref 26.0–34.0)
MCHC: 34.4 g/dL (ref 30.0–36.0)
MCV: 83.6 fL (ref 80.0–100.0)
Platelets: 187 10*3/uL (ref 150–400)
RBC: 5.84 MIL/uL — ABNORMAL HIGH (ref 4.22–5.81)
RDW: 13.6 % (ref 11.5–15.5)
WBC: 12.4 10*3/uL — ABNORMAL HIGH (ref 4.0–10.5)
nRBC: 0 % (ref 0.0–0.2)

## 2019-05-19 LAB — LIPASE, BLOOD: Lipase: 8260 U/L — ABNORMAL HIGH (ref 11–51)

## 2019-05-19 MED ORDER — FENTANYL CITRATE (PF) 100 MCG/2ML IJ SOLN
50.0000 ug | INTRAMUSCULAR | Status: AC | PRN
  Administered 2019-05-19 (×2): 50 ug via INTRAVENOUS
  Filled 2019-05-19 (×2): qty 2

## 2019-05-19 MED ORDER — ONDANSETRON HCL 4 MG/2ML IJ SOLN
4.0000 mg | Freq: Once | INTRAMUSCULAR | Status: AC
  Administered 2019-05-19: 4 mg via INTRAVENOUS
  Filled 2019-05-19: qty 2

## 2019-05-19 MED ORDER — MORPHINE SULFATE (PF) 4 MG/ML IV SOLN
4.0000 mg | Freq: Once | INTRAVENOUS | Status: AC
  Administered 2019-05-19: 22:00:00 4 mg via INTRAVENOUS
  Filled 2019-05-19: qty 1

## 2019-05-19 MED ORDER — ONDANSETRON HCL 4 MG/2ML IJ SOLN
4.0000 mg | Freq: Once | INTRAMUSCULAR | Status: AC | PRN
  Administered 2019-05-19: 20:00:00 4 mg via INTRAVENOUS
  Filled 2019-05-19: qty 2

## 2019-05-19 MED ORDER — SODIUM CHLORIDE 0.9 % IV BOLUS
1000.0000 mL | Freq: Once | INTRAVENOUS | Status: AC
  Administered 2019-05-19: 22:00:00 1000 mL via INTRAVENOUS

## 2019-05-19 MED ORDER — HYDROMORPHONE HCL 1 MG/ML IJ SOLN
INTRAMUSCULAR | Status: AC
  Administered 2019-05-19: 1 mg via INTRAVENOUS
  Filled 2019-05-19: qty 1

## 2019-05-19 MED ORDER — SODIUM CHLORIDE 0.9% FLUSH
3.0000 mL | Freq: Once | INTRAVENOUS | Status: AC
  Administered 2019-05-19: 10 mL via INTRAVENOUS

## 2019-05-19 MED ORDER — IOHEXOL 300 MG/ML  SOLN
100.0000 mL | Freq: Once | INTRAMUSCULAR | Status: AC | PRN
  Administered 2019-05-19: 100 mL via INTRAVENOUS

## 2019-05-19 MED ORDER — HYDROMORPHONE HCL 1 MG/ML IJ SOLN
1.0000 mg | Freq: Once | INTRAMUSCULAR | Status: AC
  Administered 2019-05-19: 22:00:00 1 mg via INTRAVENOUS

## 2019-05-19 NOTE — H&P (Signed)
[facility]   PATIENT NAME: Steve Hunt    MR#:  [id number]  DATE OF BIRTH:  11-30-1975  DATE OF ADMISSION:  05/19/2019  PRIMARY CARE PHYSICIAN: Patient, No Pcp Per   REQUESTING/REFERRING PHYSICIAN: Derrill Kay, MD  CHIEF COMPLAINT:   Chief Complaint  Patient presents with  . Abdominal Pain    HISTORY OF PRESENT ILLNESS:  Steve Hunt  is a 43 y.o. male who presents with chief complaint as above.  Patient presents to the ED with a complaint of acute onset abdominal pain radiating to his back.  It is in his epigastric region.  He states this happened shortly after eating some chicken.  It was associated with nausea.  On evaluation here in the ED he has a lipase of 8000.  He has previously had his gallbladder removed.  He states that he drinks, but not excessively.  Hospitalist called for admission  PAST MEDICAL HISTORY:   Past Medical History:  Diagnosis Date  . Gallstones      PAST SURGICAL HISTORY:   Past Surgical History:  Procedure Laterality Date  . CHOLECYSTECTOMY N/A 03/31/2019   Procedure: LAPAROSCOPIC CHOLECYSTECTOMY;  Surgeon: Sung Amabile, DO;  Location: [facility] ORS;  Service: General;  Laterality: N/A;     SOCIAL HISTORY:   Social History   Tobacco Use  . Smoking status: Never Smoker  . Smokeless tobacco: Never Used  Substance Use Topics  . Alcohol use: Yes    Alcohol/week: 3.0 standard drinks    Types: 3 Cans of beer per week     FAMILY HISTORY:    Family history reviewed and is non-contributory DRUG ALLERGIES:  No Known Allergies  MEDICATIONS AT HOME:   Prior to Admission medications   Medication Sig Start Date End Date Taking? Authorizing Provider  ibuprofen (ADVIL) 800 MG tablet Take 1 tablet (800 mg total) by mouth every 8 (eight) hours as needed for mild pain or moderate pain. 04/01/19   Sung Amabile, DO    REVIEW OF SYSTEMS:  Review of Systems  Constitutional: Negative  for chills, fever, malaise/fatigue and weight loss.  HENT: Negative for ear pain, hearing loss and tinnitus.   Eyes: Negative for blurred vision, double vision, pain and redness.  Respiratory: Negative for cough, hemoptysis and shortness of breath.   Cardiovascular: Negative for chest pain, palpitations, orthopnea and leg swelling.  Gastrointestinal: Positive for abdominal pain and nausea. Negative for constipation, diarrhea and vomiting.  Genitourinary: Negative for dysuria, frequency and hematuria.  Musculoskeletal: Negative for back pain, joint pain and neck pain.  Skin:       No acne, rash, or lesions  Neurological: Negative for dizziness, tremors, focal weakness and weakness.  Endo/Heme/Allergies: Negative for polydipsia. Does not bruise/bleed easily.  Psychiatric/Behavioral: Negative for depression. The patient is not nervous/anxious and does not have insomnia.      VITAL SIGNS:   Vitals:   05/19/19 1923 05/19/19 1924 05/19/19 2100  BP: (!) 150/97  (!) 144/97  Pulse: 79  64  Resp: 18  (!) 8  Temp: 97.9 F (36.6 C)    TempSrc: Oral    SpO2: 99%  99%  Weight:  108.9 kg   Height:  6\' 2"  (1.88 m)    Wt Readings from Last 3 Encounters:  05/19/19 108.9 kg  03/31/19 108.9 kg    PHYSICAL EXAMINATION:  Physical Exam  Vitals reviewed. Constitutional: He is oriented to person, place, and time. He appears well-developed and well-nourished.  No distress.  HENT:  Head: Normocephalic and atraumatic.  Mouth/Throat: Oropharynx is clear and moist.  Eyes: Pupils are equal, round, and reactive to light. Conjunctivae and EOM are normal. No scleral icterus.  Neck: Normal range of motion. Neck supple. No JVD present. No thyromegaly present.  Cardiovascular: Normal rate, regular rhythm and intact distal pulses. Exam reveals no gallop and no friction rub.  No murmur heard. Respiratory: Effort normal and breath sounds normal. No respiratory distress. He has no wheezes. He has no rales.  GI:  Soft. Bowel sounds are normal. He exhibits no distension. There is abdominal tenderness.  Musculoskeletal: Normal range of motion.        General: No edema.     Comments: No arthritis, no gout  Lymphadenopathy:    He has no cervical adenopathy.  Neurological: He is alert and oriented to person, place, and time. No cranial nerve deficit.  No dysarthria, no aphasia  Skin: Skin is warm and dry. No rash noted. No erythema.  Psychiatric: He has a normal mood and affect. His behavior is normal. Judgment and thought content normal.    LABORATORY PANEL:   CBC Recent Labs  Lab 05/19/19 1935  WBC 12.4*  HGB 16.8  HCT 48.8  PLT 187   ------------------------------------------------------------------------------------------------------------------  Chemistries  Recent Labs  Lab 05/19/19 1935  NA 141  K 3.5  CL 103  CO2 26  GLUCOSE 154*  BUN 14  CREATININE 1.04  CALCIUM 9.2  AST 297*  ALT 160*  ALKPHOS 129*  BILITOT 1.3*   ------------------------------------------------------------------------------------------------------------------  Cardiac Enzymes No results for input(s): TROPONINI in the last 168 hours. ------------------------------------------------------------------------------------------------------------------  RADIOLOGY:  Ct Abdomen Pelvis W Contrast  Result Date: 05/19/2019 CLINICAL DATA:  Initial evaluation for acute epigastric abdominal pain, vomiting. EXAM: CT ABDOMEN AND PELVIS WITH CONTRAST TECHNIQUE: Multidetector CT imaging of the abdomen and pelvis was performed using the standard protocol following bolus administration of intravenous contrast. CONTRAST:  OMNIPAQUE IOHEXOL 300 MG/ML  SOLN COMPARISON:  Prior ultrasound from 03/31/2019. FINDINGS: Lower chest: Scattered atelectatic changes seen dependently within the visualized lung bases. Visualized lungs are otherwise clear. Hepatobiliary: Liver demonstrates a normal contrast enhanced appearance.  Gallbladder surgically absent. No biliary dilatation. Pancreas: Extensive inflammatory stranding seen diffusely about the pancreas, consistent with acute pancreatitis. No evidence for pancreatic necrosis, loculated peripancreatic collection, or other complication. No abnormal pancreatic ductal dilatation. Associated stranding with small volume free fluid extends inferiorly within the mid abdomen. Spleen: Spleen within normal limits. Adrenals/Urinary Tract: Adrenal glands are normal. Kidneys equal in size with symmetric enhancement. No nephrolithiasis, hydronephrosis or focal enhancing renal mass. No hydroureter. Partially distended bladder within normal limits. Stomach/Bowel: Hazy inflammatory stranding with small volume free fluid partially surrounds the stomach and duodenum related to the inflammatory process within the adjacent pancreas. No evidence for bowel obstruction. Normal appendix. No other acute inflammatory changes seen about the bowels. Vascular/Lymphatic: Normal intravascular enhancement seen throughout the intra-abdominal aorta. Mesenteric vessels patent proximally. No evidence for portal or splenic vein thrombosis. SMV is patent. Mildly prominent 8 mm lower paraesophageal lymph node noted (series 2, image 14), indeterminate, but could be reactive. 13 mm peripancreatic nodes noted as well, likely reactive. Additional note made of a mildly enlarged 12 mm left external iliac node, indeterminate (series 2, image 83). Reproductive: Prostate seminal vesicles within normal limits. Other: No free intraperitoneal air. Small mildly complex fat containing paraumbilical hernia noted. Musculoskeletal: No acute osseous finding. No discrete lytic or blastic osseous lesions. IMPRESSION: 1. Findings  consistent with acute pancreatitis. No evidence for pancreatic necrosis or other complication. 2. No other acute intra-abdominal or pelvic process identified. 3. Incidental mildly enlarged 12 mm left external iliac node,  indeterminate, but could be reactive. Attention at follow-up recommended. 4. Status post cholecystectomy. Electronically Signed   By: Rise Mu M.D.   On: 05/19/2019 22:12    EKG:   Orders placed or performed during the hospital encounter of 05/19/19  . ED EKG  . ED EKG    IMPRESSION AND PLAN:  Principal Problem:   Acute pancreatitis -IV fluids, n.p.o., PRN analgesia and antiemetics, and lipase  Chart review performed and case discussed with ED provider. Labs, imaging and/or ECG reviewed by provider and discussed with patient/family. Management plans discussed with the patient and/or family.  COVID-19 status: Pending  DVT PROPHYLAXIS: SubQ lovenox   GI PROPHYLAXIS:  None  ADMISSION STATUS: Inpatient     CODE STATUS: Full Code Status History    Date Active Date Inactive Code Status Order ID Comments User Context   03/31/2019 1057 04/01/2019 1850 Full Code [id number]  Sung Amabile, DO Inpatient   Advance Care Planning Activity      TOTAL TIME TAKING CARE OF THIS PATIENT: 45 minutes.   This patient was evaluated in the context of the global COVID-19 pandemic, which necessitated consideration that the patient might be at risk for infection with the SARS-CoV-2 virus that causes COVID-19. Institutional protocols and algorithms that pertain to the evaluation of patients at risk for COVID-19 are in a state of rapid change based on information released by regulatory bodies including the CDC and federal and state organizations. These policies and algorithms were followed to the best of this provider's knowledge to date during the patient's care at this facility.  Barney Drain 05/19/2019, 10:49 PM  [facility]  Office  [phone]  CC: Primary care physician; Patient, No Pcp Per  Note:  This document was prepared using Dragon voice recognition software and may include unintentional dictation errors.

## 2019-05-19 NOTE — ED Provider Notes (Signed)
[facility] Emergency Department Provider Note   ____________________________________________   I have reviewed the triage vital signs and the nursing notes.   HISTORY  Chief Complaint Abdominal Pain   History limited by: Not Limited   HPI Steve Hunt is a 43 y.o. male who presents to the emergency department today because of concerns for abdominal pain.  Pain is located in the epigastric region.  It does then radiate throughout his abdomen.  Pain started today at around 4 PM after he finished eating some chicken.  The pain is severe.  The patient has had some associated nausea and vomiting.  Patient states the pain is worse than when he had gallstones and has gallbladder removed.  Since having his gallbladder removed he has had some occasional pains in his abdomen but nothing this severe.  Patient denies any recent fevers or illness.   Records reviewed. Per medical record review patient has a history of gallstones, cholecystectomy  Past Medical History:  Diagnosis Date  . Gallstones     Patient Active Problem List   Diagnosis Date Noted  . Acute cholecystitis 03/31/2019    Past Surgical History:  Procedure Laterality Date  . CHOLECYSTECTOMY N/A 03/31/2019   Procedure: LAPAROSCOPIC CHOLECYSTECTOMY;  Surgeon: Benjamine Sprague, DO;  Location: [facility] ORS;  Service: General;  Laterality: N/A;    Prior to Admission medications   Medication Sig Start Date End Date Taking? Authorizing Provider  ibuprofen (ADVIL) 800 MG tablet Take 1 tablet (800 mg total) by mouth every 8 (eight) hours as needed for mild pain or moderate pain. 04/01/19   Benjamine Sprague, DO    Allergies Patient has no known allergies.  No family history on file.  Social History Social History   Tobacco Use  . Smoking status: Never Smoker  . Smokeless tobacco: Never Used  Substance Use Topics  . Alcohol use: Not on file  . Drug use: Not Currently    Review of  Systems Constitutional: No fever/chills Eyes: No visual changes. ENT: No sore throat. Cardiovascular: Denies chest pain. Respiratory: Denies shortness of breath. Gastrointestinal: Positive for abdominal pain, nausea and vomiting.    Genitourinary: Negative for dysuria. Musculoskeletal: Negative for back pain. Skin: Negative for rash. Neurological: Negative for headaches, focal weakness or numbness.  ____________________________________________   PHYSICAL EXAM:  VITAL SIGNS: ED Triage Vitals  Enc Vitals Group     BP 05/19/19 1923 (!) 150/97     Pulse Rate 05/19/19 1923 79     Resp 05/19/19 1923 18     Temp 05/19/19 1923 97.9 F (36.6 C)     Temp Source 05/19/19 1923 Oral     SpO2 05/19/19 1923 99 %     Weight 05/19/19 1924 240 lb (108.9 kg)     Height 05/19/19 1924 _0  (1.88 m)     Head Circumference --      Peak Flow --      Pain Score 05/19/19 1923 7   Constitutional: Alert and oriented.  Eyes: Conjunctivae are normal.  ENT      Head: Normocephalic and atraumatic.      Nose: No congestion/rhinnorhea.      Mouth/Throat: Mucous membranes are moist.      Neck: No stridor. Hematological/Lymphatic/Immunilogical: No cervical lymphadenopathy. Cardiovascular: Normal rate, regular rhythm.  No murmurs, rubs, or gallops.  Respiratory: Normal respiratory effort without tachypnea nor retractions. Breath sounds are clear and equal bilaterally. No wheezes/rales/rhonchi. Gastrointestinal: Soft and tender to palpation in the epigastric region.  Genitourinary: Deferred Musculoskeletal: Normal range of motion in all extremities. No lower extremity edema. Neurologic:  Normal speech and language. No gross focal neurologic deficits are appreciated.  Skin:  Skin is warm, dry and intact. No rash noted. Psychiatric: Mood and affect are normal. Speech and behavior are normal. Patient exhibits appropriate insight and judgment.  ____________________________________________    LABS  (pertinent positives/negatives)  Lipase 8,260 UA hazy, protein 30, 0-5 rbc and wbc CBC wbc 12.4, hgb 16.8, plt 187 CMP wnl except glu 154, ast 297, alt 160, alk phos 129, t bili 1.3  ____________________________________________   EKG  I, Nance Pear, attending physician, personally viewed and interpreted this EKG  EKG Time: 1931 Rate: 83 Rhythm: sinus rhythm Axis: normal Intervals: qtc 460 QRS: narrow ST changes: no st elevation Impression: normal ekg  ____________________________________________    RADIOLOGY  CT abd/pel Acute pancreatitis  ____________________________________________   PROCEDURES  Procedures  ____________________________________________   INITIAL IMPRESSION / ASSESSMENT AND PLAN / ED COURSE  Pertinent labs & imaging results that were available during my care of the patient were reviewed by me and considered in my medical decision making (see chart for details).   Patient presented to the emergency department today because of concerns for epigastric pain.  Patient is status post cholecystectomy.  CT abdomen pelvis was obtained which was concerning for acute pancreatitis.  Lipase did return elevated at greater than 8000.  Discussed findings with patient.  Will plan on admission.   ____________________________________________   FINAL CLINICAL IMPRESSION(S) / ED DIAGNOSES  Final diagnoses:  Acute pancreatitis, unspecified complication status, unspecified pancreatitis type     Note: This dictation was prepared with Dragon dictation. Any transcriptional errors that result from this process are unintentional     Nance Pear, MD 05/19/19 2315

## 2019-05-19 NOTE — ED Triage Notes (Signed)
Pt arrived via POV with reports of abdominal pain that started a few hours ago after eating roasted chicken.  PT reports vomiting and is dry heaving in triage.

## 2019-05-19 NOTE — ED Notes (Signed)
ED TO INPATIENT HANDOFF REPORT  ED Nurse Name and Phone #: Welford Roche  S Name/Age/Gender Steve Hunt 43 y.o. male Room/Bed: ED16A/ED16A  Code Status   Code Status: Prior  Home/SNF/Other Home Patient oriented to: self, place, time and situation Is this baseline? Yes   Triage Complete: Triage complete  Chief Complaint Abdominal Pain  Triage Note Pt arrived via POV with reports of abdominal pain that started a few hours ago after eating roasted chicken.  PT reports vomiting and is dry heaving in triage.    Allergies No Known Allergies  Level of Care/Admitting Diagnosis ED Disposition    None      B Medical/Surgery History Past Medical History:  Diagnosis Date  . Gallstones    Past Surgical History:  Procedure Laterality Date  . CHOLECYSTECTOMY N/A 03/31/2019   Procedure: LAPAROSCOPIC CHOLECYSTECTOMY;  Surgeon: Benjamine Sprague, DO;  Location: [facility] ORS;  Service: General;  Laterality: N/A;     A IV Location/Drains/Wounds Patient Lines/Drains/Airways Status   Active Line/Drains/Airways    Name:   Placement date:   Placement time:   Site:   Days:   Peripheral IV 05/19/19 Left Antecubital   05/19/19    1934    Antecubital   less than 1   Incision (Closed) 03/31/19 Abdomen   03/31/19    1512     49   Incision - 4 Ports Abdomen 1: Right Mid;Right Mid;Upper Umbilicus   61/44/31    5400     49          Intake/Output Last 24 hours No intake or output data in the 24 hours ending 05/19/19 2223  Labs/Imaging Results for orders placed or performed during the hospital encounter of 05/19/19 (from the past 48 hour(s))  Lipase, blood     Status: Abnormal   Collection Time: 05/19/19  7:35 PM  Result Value Ref Range   Lipase 8,260 (H) 11 - 51 U/L    Comment: RESULT CONFIRMED BY MANUAL DILUTION TIK Performed at [facility], [address] [place], [place] [address]   Comprehensive metabolic panel     Status: Abnormal   Collection Time: 05/19/19   7:35 PM  Result Value Ref Range   Sodium 141 135 - 145 mmol/L   Potassium 3.5 3.5 - 5.1 mmol/L   Chloride 103 98 - 111 mmol/L   CO2 26 22 - 32 mmol/L   Glucose, Bld 154 (H) 70 - 99 mg/dL   BUN 14 6 - 20 mg/dL   Creatinine, Ser 1.04 0.61 - 1.24 mg/dL   Calcium 9.2 8.9 - 10.3 mg/dL   Total Protein 7.9 6.5 - 8.1 g/dL   Albumin 4.6 3.5 - 5.0 g/dL   AST 297 (H) 15 - 41 U/L   ALT 160 (H) 0 - 44 U/L   Alkaline Phosphatase 129 (H) 38 - 126 U/L   Total Bilirubin 1.3 (H) 0.3 - 1.2 mg/dL   GFR calc non Af Amer >60 >60 mL/min   GFR calc Af Amer >60 >60 mL/min   Anion gap 12 5 - 15    Comment: Performed at [facility], [address] [place], [place] [address]  CBC     Status: Abnormal   Collection Time: 05/19/19  7:35 PM  Result Value Ref Range   WBC 12.4 (H) 4.0 - 10.5 K/uL   RBC 5.84 (H) 4.22 - 5.81 MIL/uL   Hemoglobin 16.8 13.0 - 17.0 g/dL   HCT 48.8 39.0 - 52.0 %  MCV 83.6 80.0 - 100.0 fL   MCH 28.8 26.0 - 34.0 pg   MCHC 34.4 30.0 - 36.0 g/dL   RDW 95.213.6 84.111.5 - 32.415.5 %   Platelets 187 150 - 400 K/uL   nRBC 0.0 0.0 - 0.2 %    Comment: Performed at [facility], [address] [place], [place] [address]  Urinalysis, Complete w Microscopic     Status: Abnormal   Collection Time: 05/19/19  9:35 PM  Result Value Ref Range   Color, Urine YELLOW (A) YELLOW   APPearance HAZY (A) CLEAR   Specific Gravity, Urine 1.012 1.005 - 1.030   pH 5.0 5.0 - 8.0   Glucose, UA NEGATIVE NEGATIVE mg/dL   Hgb urine dipstick NEGATIVE NEGATIVE   Bilirubin Urine NEGATIVE NEGATIVE   Ketones, ur NEGATIVE NEGATIVE mg/dL   Protein, ur 30 (A) NEGATIVE mg/dL   Nitrite NEGATIVE NEGATIVE   Leukocytes,Ua NEGATIVE NEGATIVE   RBC / HPF 0-5 0 - 5 RBC/hpf   WBC, UA 0-5 0 - 5 WBC/hpf   Bacteria, UA NONE SEEN NONE SEEN   Squamous Epithelial / LPF NONE SEEN 0 - 5   Mucus PRESENT    Hyaline Casts, UA PRESENT     Comment: Performed at [facility], [address] [place], [place]  [address]   Ct Abdomen Pelvis W Contrast  Result Date: 05/19/2019 CLINICAL DATA:  Initial evaluation for acute epigastric abdominal pain, vomiting. EXAM: CT ABDOMEN AND PELVIS WITH CONTRAST TECHNIQUE: Multidetector CT imaging of the abdomen and pelvis was performed using the standard protocol following bolus administration of intravenous contrast. CONTRAST:  100mL OMNIPAQUE IOHEXOL 300 MG/ML  SOLN COMPARISON:  Prior ultrasound from 03/31/2019. FINDINGS: Lower chest: Scattered atelectatic changes seen dependently within the visualized lung bases. Visualized lungs are otherwise clear. Hepatobiliary: Liver demonstrates a normal contrast enhanced appearance. Gallbladder surgically absent. No biliary dilatation. Pancreas: Extensive inflammatory stranding seen diffusely about the pancreas, consistent with acute pancreatitis. No evidence for pancreatic necrosis, loculated peripancreatic collection, or other complication. No abnormal pancreatic ductal dilatation. Associated stranding with small volume free fluid extends inferiorly within the mid abdomen. Spleen: Spleen within normal limits. Adrenals/Urinary Tract: Adrenal glands are normal. Kidneys equal in size with symmetric enhancement. No nephrolithiasis, hydronephrosis or focal enhancing renal mass. No hydroureter. Partially distended bladder within normal limits. Stomach/Bowel: Hazy inflammatory stranding with small volume free fluid partially surrounds the stomach and duodenum related to the inflammatory process within the adjacent pancreas. No evidence for bowel obstruction. Normal appendix. No other acute inflammatory changes seen about the bowels. Vascular/Lymphatic: Normal intravascular enhancement seen throughout the intra-abdominal aorta. Mesenteric vessels patent proximally. No evidence for portal or splenic vein thrombosis. SMV is patent. Mildly prominent 8 mm lower paraesophageal lymph node noted (series 2, image 14), indeterminate, but could be reactive. 13  mm peripancreatic nodes noted as well, likely reactive. Additional note made of a mildly enlarged 12 mm left external iliac node, indeterminate (series 2, image 83). Reproductive: Prostate seminal vesicles within normal limits. Other: No free intraperitoneal air. Small mildly complex fat containing paraumbilical hernia noted. Musculoskeletal: No acute osseous finding. No discrete lytic or blastic osseous lesions. IMPRESSION: 1. Findings consistent with acute pancreatitis. No evidence for pancreatic necrosis or other complication. 2. No other acute intra-abdominal or pelvic process identified. 3. Incidental mildly enlarged 12 mm left external iliac node, indeterminate, but could be reactive. Attention at follow-up recommended. 4. Status post cholecystectomy. Electronically Signed   By: Janell QuietBenjamin  McClintock M.D.  On: 05/19/2019 22:12    Pending Labs Unresulted Labs (From admission, onward)    Start     Ordered   05/19/19 2221  SARS CORONAVIRUS 2 (TAT 6-24 HRS) Nasopharyngeal Nasopharyngeal Swab  (Asymptomatic/Tier 2 Patients Labs)  Once,   STAT    Question Answer Comment  Is this test for diagnosis or screening Screening   Symptomatic for COVID-19 as defined by CDC Unknown   Hospitalized for COVID-19 Unknown   Admitted to ICU for COVID-19 Unknown   Previously tested for COVID-19 Unknown   Resident in a congregate (group) care setting Unknown   Employed in healthcare setting Unknown      05/19/19 2220          Vitals/Pain Today's Vitals   05/19/19 1924 05/19/19 2023 05/19/19 2100 05/19/19 2207  BP:   (!) 144/97   Pulse:   64   Resp:   (!) 8   Temp:      TempSrc:      SpO2:   99%   Weight: 108.9 kg     Height: 6\' 2"  (1.88 m)     PainSc:  10-Worst pain ever  10-Worst pain ever    Isolation Precautions No active isolations  Medications Medications  sodium chloride flush (NS) 0.9 % injection 3 mL (10 mLs Intravenous Given 05/19/19 1937)  ondansetron (ZOFRAN) injection 4 mg (4 mg  Intravenous Given 05/19/19 1938)  fentaNYL (SUBLIMAZE) injection 50 mcg (50 mcg Intravenous Given 05/19/19 2023)  morphine 4 MG/ML injection 4 mg (4 mg Intravenous Given 05/19/19 2134)  ondansetron (ZOFRAN) injection 4 mg (4 mg Intravenous Given 05/19/19 2133)  sodium chloride 0.9 % bolus 1,000 mL (1,000 mLs Intravenous New Bag/Given 05/19/19 2132)  iohexol (OMNIPAQUE) 300 MG/ML solution 100 mL (100 mLs Intravenous Contrast Given 05/19/19 2147)    Mobility walks Low fall risk   Focused Assessments Gastro/   R Recommendations: See Admitting Provider Note  Report given to:   Additional Notes:

## 2019-05-19 NOTE — ED Notes (Signed)
Patient transported to CT 

## 2019-05-20 LAB — BASIC METABOLIC PANEL
Anion gap: 10 (ref 5–15)
BUN: 14 mg/dL (ref 6–20)
CO2: 24 mmol/L (ref 22–32)
Calcium: 8.3 mg/dL — ABNORMAL LOW (ref 8.9–10.3)
Chloride: 107 mmol/L (ref 98–111)
Creatinine, Ser: 0.98 mg/dL (ref 0.61–1.24)
GFR calc Af Amer: >60 mL/min (ref >60)
GFR calc non Af Amer: >60 mL/min (ref >60)
Glucose, Bld: 154 mg/dL — ABNORMAL HIGH (ref 70–99)
Potassium: 4.3 mmol/L (ref 3.5–5.1)
Sodium: 141 mmol/L (ref 135–145)

## 2019-05-20 LAB — CBC
HCT: 48.5 % (ref 39.0–52.0)
Hemoglobin: 16.2 g/dL (ref 13.0–17.0)
MCH: 28.1 pg (ref 26.0–34.0)
MCHC: 33.4 g/dL (ref 30.0–36.0)
MCV: 84.2 fL (ref 80.0–100.0)
Platelets: 171 10*3/uL (ref 150–400)
RBC: 5.76 MIL/uL (ref 4.22–5.81)
RDW: 14 % (ref 11.5–15.5)
WBC: 11.4 10*3/uL — ABNORMAL HIGH (ref 4.0–10.5)
nRBC: 0 % (ref 0.0–0.2)

## 2019-05-20 LAB — LIPASE, BLOOD: Lipase: 597 U/L — ABNORMAL HIGH (ref 11–51)

## 2019-05-20 LAB — SARS CORONAVIRUS 2 (TAT 6-24 HRS): SARS Coronavirus 2: NEGATIVE

## 2019-05-20 MED ORDER — ACETAMINOPHEN 650 MG RE SUPP: 650.0000 mg | Freq: Four times a day (QID) | RECTAL | Status: DC | PRN

## 2019-05-20 MED ORDER — OXYCODONE HCL 5 MG PO TABS
5.0000 mg | ORAL_TABLET | ORAL | Status: DC | PRN
  Administered 2019-05-20 – 2019-05-24 (×12): 5 mg via ORAL
  Filled 2019-05-20 (×13): qty 1

## 2019-05-20 MED ORDER — ONDANSETRON HCL 4 MG PO TABS: 4.0000 mg | ORAL_TABLET | Freq: Four times a day (QID) | ORAL | Status: DC | PRN

## 2019-05-20 MED ORDER — SODIUM CHLORIDE 0.9 % IV SOLN
INTRAVENOUS | Status: DC
  Administered 2019-05-20 – 2019-05-24 (×10): via INTRAVENOUS

## 2019-05-20 MED ORDER — ENOXAPARIN SODIUM 40 MG/0.4ML ~~LOC~~ SOLN
40.0000 mg | SUBCUTANEOUS | Status: DC
  Administered 2019-05-20 – 2019-05-23 (×4): 40 mg via SUBCUTANEOUS
  Filled 2019-05-20 (×4): qty 0.4

## 2019-05-20 MED ORDER — HYDROMORPHONE HCL 1 MG/ML IJ SOLN
0.5000 mg | INTRAMUSCULAR | Status: DC | PRN
  Administered 2019-05-20 – 2019-05-24 (×6): 0.5 mg via INTRAVENOUS
  Filled 2019-05-20 (×4): qty 0.5
  Filled 2019-05-20: qty 1
  Filled 2019-05-20: qty 0.5

## 2019-05-20 MED ORDER — ACETAMINOPHEN 325 MG PO TABS
650.0000 mg | ORAL_TABLET | Freq: Four times a day (QID) | ORAL | Status: DC | PRN
  Administered 2019-05-22: 650 mg via ORAL
  Filled 2019-05-20: qty 2

## 2019-05-20 MED ORDER — PANTOPRAZOLE SODIUM 40 MG IV SOLR
40.0000 mg | INTRAVENOUS | Status: DC
  Administered 2019-05-20 – 2019-05-23 (×4): 40 mg via INTRAVENOUS
  Filled 2019-05-20 (×4): qty 40

## 2019-05-20 MED ORDER — ONDANSETRON HCL 4 MG/2ML IJ SOLN: 4.0000 mg | Freq: Four times a day (QID) | INTRAMUSCULAR | Status: DC | PRN

## 2019-05-20 NOTE — Progress Notes (Signed)
[facility] [place] [facility]   PATIENT NAME: Steve Hunt    MR#:  [id number]  DATE OF BIRTH:  Feb 04, 1976  SUBJECTIVE:  CHIEF COMPLAINT: Patient is resting comfortably related epigastric abdominal pain with some nausea.  Status post cholecystectomy  REVIEW OF SYSTEMS:  CONSTITUTIONAL: No fever, fatigue or weakness.  EYES: No blurred or double vision.  EARS, NOSE, AND THROAT: No tinnitus or ear pain.  RESPIRATORY: No cough, shortness of breath, wheezing or hemoptysis.  CARDIOVASCULAR: No chest pain, orthopnea, edema.  GASTROINTESTINAL: Reporting epigastric abdominal pain with nausea GENITOURINARY: No dysuria, hematuria.  ENDOCRINE: No polyuria, nocturia,  HEMATOLOGY: No anemia, easy bruising or bleeding SKIN: No rash or lesion. MUSCULOSKELETAL: No joint pain or arthritis.   NEUROLOGIC: No tingling, numbness, weakness.  PSYCHIATRY: No anxiety or depression.   DRUG ALLERGIES:  No Known Allergies  VITALS:  Blood pressure (!) 141/103, pulse 83, temperature 98.4 F (36.9 C), temperature source Oral, resp. rate 20, height 6\' 2"  (1.88 m), weight 108.9 kg, SpO2 97 %.  PHYSICAL EXAMINATION:  GENERAL:  43 y.o.-year-old patient lying in the bed with no acute distress.  EYES: Pupils equal, round, reactive to light and accommodation. No scleral icterus. Extraocular muscles intact.  HEENT: Head atraumatic, normocephalic. Oropharynx and nasopharynx clear.  NECK:  Supple, no jugular venous distention. No thyroid enlargement, no tenderness.  LUNGS: Normal breath sounds bilaterally, no wheezing, rales,rhonchi or crepitation. No use of accessory muscles of respiration.  CARDIOVASCULAR: S1, S2 normal. No murmurs, rubs, or gallops.  ABDOMEN: Soft, epigastric tenderness is present no rebound tenderness bowel sounds present.  Cholecystectomy scar is present  eXTREMITIES: No pedal edema, cyanosis, or clubbing.  NEUROLOGIC: Cranial nerves II through XII are  intact. Muscle strength 5/5 in all extremities. Sensation intact. Gait not checked.  PSYCHIATRIC: The patient is alert and oriented x 3.  SKIN: No obvious rash, lesion, or ulcer.    LABORATORY PANEL:   CBC Recent Labs  Lab 05/20/19 0519  WBC 11.4*  HGB 16.2  HCT 48.5  PLT 171   ------------------------------------------------------------------------------------------------------------------  Chemistries  Recent Labs  Lab 05/19/19 1935 05/20/19 0519  NA 141 141  K 3.5 4.3  CL 103 107  CO2 26 24  GLUCOSE 154* 154*  BUN 14 14  CREATININE 1.04 0.98  CALCIUM 9.2 8.3*  AST 297*  --   ALT 160*  --   ALKPHOS 129*  --   BILITOT 1.3*  --    ------------------------------------------------------------------------------------------------------------------  Cardiac Enzymes No results for input(s): TROPONINI in the last 168 hours. ------------------------------------------------------------------------------------------------------------------  RADIOLOGY:  Ct Abdomen Pelvis W Contrast  Result Date: 05/19/2019 CLINICAL DATA:  Initial evaluation for acute epigastric abdominal pain, vomiting. EXAM: CT ABDOMEN AND PELVIS WITH CONTRAST TECHNIQUE: Multidetector CT imaging of the abdomen and pelvis was performed using the standard protocol following bolus administration of intravenous contrast. CONTRAST:  OMNIPAQUE IOHEXOL 300 MG/ML  SOLN COMPARISON:  Prior ultrasound from 03/31/2019. FINDINGS: Lower chest: Scattered atelectatic changes seen dependently within the visualized lung bases. Visualized lungs are otherwise clear. Hepatobiliary: Liver demonstrates a normal contrast enhanced appearance. Gallbladder surgically absent. No biliary dilatation. Pancreas: Extensive inflammatory stranding seen diffusely about the pancreas, consistent with acute pancreatitis. No evidence for pancreatic necrosis, loculated peripancreatic collection, or other complication. No abnormal pancreatic ductal  dilatation. Associated stranding with small volume free fluid extends inferiorly within the mid abdomen. Spleen: Spleen within normal limits. Adrenals/Urinary Tract: Adrenal glands are normal. Kidneys equal in size with symmetric  enhancement. No nephrolithiasis, hydronephrosis or focal enhancing renal mass. No hydroureter. Partially distended bladder within normal limits. Stomach/Bowel: Hazy inflammatory stranding with small volume free fluid partially surrounds the stomach and duodenum related to the inflammatory process within the adjacent pancreas. No evidence for bowel obstruction. Normal appendix. No other acute inflammatory changes seen about the bowels. Vascular/Lymphatic: Normal intravascular enhancement seen throughout the intra-abdominal aorta. Mesenteric vessels patent proximally. No evidence for portal or splenic vein thrombosis. SMV is patent. Mildly prominent 8 mm lower paraesophageal lymph node noted (series 2, image 14), indeterminate, but could be reactive. 13 mm peripancreatic nodes noted as well, likely reactive. Additional note made of a mildly enlarged 12 mm left external iliac node, indeterminate (series 2, image 83). Reproductive: Prostate seminal vesicles within normal limits. Other: No free intraperitoneal air. Small mildly complex fat containing paraumbilical hernia noted. Musculoskeletal: No acute osseous finding. No discrete lytic or blastic osseous lesions. IMPRESSION: 1. Findings consistent with acute pancreatitis. No evidence for pancreatic necrosis or other complication. 2. No other acute intra-abdominal or pelvic process identified. 3. Incidental mildly enlarged 12 mm left external iliac node, indeterminate, but could be reactive. Attention at follow-up recommended. 4. Status post cholecystectomy. Electronically Signed   By: Rise Mu M.D.   On: 05/19/2019 22:12    EKG:   Orders placed or performed during the hospital encounter of 05/19/19  . ED EKG  . ED EKG  .  EKG 12-Lead  . EKG 12-Lead    ASSESSMENT AND PLAN:   #Acute epigastric abdominal pain secondary to acute pancreatitis-from alcohol abuse N.p.o., with ice chips, IV fluids Status post cholecystectomy Pain management as needed Check a.m. labs including lipase Lipase level [id number] COVID pending  #External iliac inguinal node-reactive probably Continue close monitoring PCP to follow-up  #Obesity Lifestyle modifications advised  #Alcohol abuse Outpatient alcohol Anonymous CIWA   GI prophylaxis with Protonix and DVT prophylaxis with Lovenox subcu  All the records are reviewed and case discussed with Care Management/Social Workerr. Management plans discussed with the patient, family and they are in agreement.  CODE STATUS: Full code  TOTAL TIME TAKING CARE OF THIS PATIENT: .   POSSIBLE D/C IN 1-2DAYS, DEPENDING ON CLINICAL CONDITION.  Note: This dictation was prepared with Dragon dictation along with smaller phrase technology. Any transcriptional errors that result from this process are unintentional.   Ramonita Lab M.D on 05/20/2019 at 1:59 PM  Between 7am to 6pm - Pager - [phone] After 6pm go to [email or web address] - password [email or web address]  [facility]  Office  [phone]  CC: Primary care physician; Patient, No Pcp Per

## 2019-05-21 LAB — COMPREHENSIVE METABOLIC PANEL
ALT: 107 U/L — ABNORMAL HIGH (ref 0–44)
AST: 49 U/L — ABNORMAL HIGH (ref 15–41)
Albumin: 3.4 g/dL — ABNORMAL LOW (ref 3.5–5.0)
Alkaline Phosphatase: 83 U/L (ref 38–126)
Anion gap: 8 (ref 5–15)
BUN: 15 mg/dL (ref 6–20)
CO2: 25 mmol/L (ref 22–32)
Calcium: 7.9 mg/dL — ABNORMAL LOW (ref 8.9–10.3)
Chloride: 107 mmol/L (ref 98–111)
Creatinine, Ser: 0.99 mg/dL (ref 0.61–1.24)
GFR calc Af Amer: >60 mL/min (ref >60)
GFR calc non Af Amer: >60 mL/min (ref >60)
Glucose, Bld: 107 mg/dL — ABNORMAL HIGH (ref 70–99)
Potassium: 3.9 mmol/L (ref 3.5–5.1)
Sodium: 140 mmol/L (ref 135–145)
Total Bilirubin: 1.9 mg/dL — ABNORMAL HIGH (ref 0.3–1.2)
Total Protein: 6.2 g/dL — ABNORMAL LOW (ref 6.5–8.1)

## 2019-05-21 LAB — CBC
HCT: 47.1 % (ref 39.0–52.0)
Hemoglobin: 15.5 g/dL (ref 13.0–17.0)
MCH: 28.4 pg (ref 26.0–34.0)
MCHC: 32.9 g/dL (ref 30.0–36.0)
MCV: 86.4 fL (ref 80.0–100.0)
Platelets: 142 10*3/uL — ABNORMAL LOW (ref 150–400)
RBC: 5.45 MIL/uL (ref 4.22–5.81)
RDW: 14.2 % (ref 11.5–15.5)
WBC: 12.7 10*3/uL — ABNORMAL HIGH (ref 4.0–10.5)
nRBC: 0 % (ref 0.0–0.2)

## 2019-05-21 LAB — LIPASE, BLOOD: Lipase: 168 U/L — ABNORMAL HIGH (ref 11–51)

## 2019-05-21 NOTE — Progress Notes (Signed)
[facility] - [place] at [facility]   PATIENT NAME: Steve Hunt    MR#:  [id number]  DATE OF BIRTH:  10/25/1975  SUBJECTIVE:  CHIEF COMPLAINT: Patient is reporting 3out of 10 epigastric abdominal pain with some nausea.  Status post cholecystectomy in the past  REVIEW OF SYSTEMS:  CONSTITUTIONAL: No fever, fatigue or weakness.  EYES: No blurred or double vision.  EARS, NOSE, AND THROAT: No tinnitus or ear pain.  RESPIRATORY: No cough, shortness of breath, wheezing or hemoptysis.  CARDIOVASCULAR: No chest pain, orthopnea, edema.  GASTROINTESTINAL: Reporting epigastric abdominal pain with nausea GENITOURINARY: No dysuria, hematuria.  ENDOCRINE: No polyuria, nocturia,  HEMATOLOGY: No anemia, easy bruising or bleeding SKIN: No rash or lesion. MUSCULOSKELETAL: No joint pain or arthritis.   NEUROLOGIC: No tingling, numbness, weakness.  PSYCHIATRY: No anxiety or depression.   DRUG ALLERGIES:  No Known Allergies  VITALS:  Blood pressure 125/85, pulse 88, temperature 99.9 F (37.7 C), temperature source Oral, resp. rate 19, height 6\' 2"  (1.88 m), weight 108.9 kg, SpO2 95 %.  PHYSICAL EXAMINATION:  GENERAL:  43 y.o.-year-old patient lying in the bed with no acute distress.  EYES: Pupils equal, round, reactive to light and accommodation. No scleral icterus. Extraocular muscles intact.  HEENT: Head atraumatic, normocephalic. Oropharynx and nasopharynx clear.  NECK:  Supple, no jugular venous distention. No thyroid enlargement, no tenderness.  LUNGS: Normal breath sounds bilaterally, no wheezing, rales,rhonchi or crepitation. No use of accessory muscles of respiration.  CARDIOVASCULAR: S1, S2 normal. No murmurs, rubs, or gallops.  ABDOMEN: Soft, epigastric tenderness is present no rebound tenderness bowel sounds present.  Cholecystectomy scar is present  eXTREMITIES: No pedal edema, cyanosis, or clubbing.  NEUROLOGIC: Cranial nerves II through XII are  intact. Muscle strength 5/5 in all extremities. Sensation intact. Gait not checked.  PSYCHIATRIC: The patient is alert and oriented x 3.  SKIN: No obvious rash, lesion, or ulcer.    LABORATORY PANEL:   CBC Recent Labs  Lab 05/21/19 0436  WBC 12.7*  HGB 15.5  HCT 47.1  PLT 142*   ------------------------------------------------------------------------------------------------------------------  Chemistries  Recent Labs  Lab 05/21/19 0436  NA 140  K 3.9  CL 107  CO2 25  GLUCOSE 107*  BUN 15  CREATININE 0.99  CALCIUM 7.9*  AST 49*  ALT 107*  ALKPHOS 83  BILITOT 1.9*   ------------------------------------------------------------------------------------------------------------------  Cardiac Enzymes No results for input(s): TROPONINI in the last 168 hours. ------------------------------------------------------------------------------------------------------------------  RADIOLOGY:  Ct Abdomen Pelvis W Contrast  Result Date: 05/19/2019 CLINICAL DATA:  Initial evaluation for acute epigastric abdominal pain, vomiting. EXAM: CT ABDOMEN AND PELVIS WITH CONTRAST TECHNIQUE: Multidetector CT imaging of the abdomen and pelvis was performed using the standard protocol following bolus administration of intravenous contrast. CONTRAST:  OMNIPAQUE IOHEXOL 300 MG/ML  SOLN COMPARISON:  Prior ultrasound from 03/31/2019. FINDINGS: Lower chest: Scattered atelectatic changes seen dependently within the visualized lung bases. Visualized lungs are otherwise clear. Hepatobiliary: Liver demonstrates a normal contrast enhanced appearance. Gallbladder surgically absent. No biliary dilatation. Pancreas: Extensive inflammatory stranding seen diffusely about the pancreas, consistent with acute pancreatitis. No evidence for pancreatic necrosis, loculated peripancreatic collection, or other complication. No abnormal pancreatic ductal dilatation. Associated stranding with small volume free fluid extends  inferiorly within the mid abdomen. Spleen: Spleen within normal limits. Adrenals/Urinary Tract: Adrenal glands are normal. Kidneys equal in size with symmetric enhancement. No nephrolithiasis, hydronephrosis or focal enhancing renal mass. No hydroureter. Partially distended bladder within normal limits. Stomach/Bowel: Hazy  inflammatory stranding with small volume free fluid partially surrounds the stomach and duodenum related to the inflammatory process within the adjacent pancreas. No evidence for bowel obstruction. Normal appendix. No other acute inflammatory changes seen about the bowels. Vascular/Lymphatic: Normal intravascular enhancement seen throughout the intra-abdominal aorta. Mesenteric vessels patent proximally. No evidence for portal or splenic vein thrombosis. SMV is patent. Mildly prominent 8 mm lower paraesophageal lymph node noted (series 2, image 14), indeterminate, but could be reactive. 13 mm peripancreatic nodes noted as well, likely reactive. Additional note made of a mildly enlarged 12 mm left external iliac node, indeterminate (series 2, image 83). Reproductive: Prostate seminal vesicles within normal limits. Other: No free intraperitoneal air. Small mildly complex fat containing paraumbilical hernia noted. Musculoskeletal: No acute osseous finding. No discrete lytic or blastic osseous lesions. IMPRESSION: 1. Findings consistent with acute pancreatitis. No evidence for pancreatic necrosis or other complication. 2. No other acute intra-abdominal or pelvic process identified. 3. Incidental mildly enlarged 12 mm left external iliac node, indeterminate, but could be reactive. Attention at follow-up recommended. 4. Status post cholecystectomy. Electronically Signed   By: Rise Mu M.D.   On: 05/19/2019 22:12    EKG:   Orders placed or performed during the hospital encounter of 05/19/19  . ED EKG  . ED EKG  . EKG 12-Lead  . EKG 12-Lead    ASSESSMENT AND PLAN:   #Acute  epigastric abdominal pain secondary to acute pancreatitis-from alcohol abuse N.p.o., with ice chips, IV fluids Status post cholecystectomy Pain management as needed Check a.m. labs including lipase Lipase level [phone] Start clear liquid diet and advance as tolerated COVID negative  #External iliac inguinal node-reactive probably Continue close monitoring PCP to follow-up  #Obesity Lifestyle modifications advised  #Alcohol abuse Outpatient alcohol Anonymous CIWA   GI prophylaxis with Protonix and DVT prophylaxis with Lovenox subcu  All the records are reviewed and case discussed with Care Management/Social Workerr. Management plans discussed with the patient, family and they are in agreement.  CODE STATUS: Full code  TOTAL TIME TAKING CARE OF THIS PATIENT: .   POSSIBLE D/C IN 1-DAYS, DEPENDING ON CLINICAL CONDITION.  Note: This dictation was prepared with Dragon dictation along with smaller phrase technology. Any transcriptional errors that result from this process are unintentional.   Ramonita Lab M.D on 05/21/2019 at 3:36 PM  Between 7am to 6pm - Pager - [phone] After 6pm go to [email or web address] - password [email or web address]  [facility]  Office  [phone]  CC: Primary care physician; Patient, No Pcp Per

## 2019-05-22 ENCOUNTER — Inpatient Hospital Stay: Payer: Medicaid Other

## 2019-05-22 LAB — COMPREHENSIVE METABOLIC PANEL
ALT: 67 U/L — ABNORMAL HIGH (ref 0–44)
AST: 28 U/L (ref 15–41)
Albumin: 3.2 g/dL — ABNORMAL LOW (ref 3.5–5.0)
Alkaline Phosphatase: 77 U/L (ref 38–126)
Anion gap: 6 (ref 5–15)
BUN: 11 mg/dL (ref 6–20)
CO2: 26 mmol/L (ref 22–32)
Calcium: 7.9 mg/dL — ABNORMAL LOW (ref 8.9–10.3)
Chloride: 105 mmol/L (ref 98–111)
Creatinine, Ser: 0.92 mg/dL (ref 0.61–1.24)
GFR calc Af Amer: >60 mL/min (ref >60)
GFR calc non Af Amer: >60 mL/min (ref >60)
Glucose, Bld: 119 mg/dL — ABNORMAL HIGH (ref 70–99)
Potassium: 3.8 mmol/L (ref 3.5–5.1)
Sodium: 137 mmol/L (ref 135–145)
Total Bilirubin: 2.3 mg/dL — ABNORMAL HIGH (ref 0.3–1.2)
Total Protein: 6.1 g/dL — ABNORMAL LOW (ref 6.5–8.1)

## 2019-05-22 LAB — CBC
HCT: 41.8 % (ref 39.0–52.0)
Hemoglobin: 14 g/dL (ref 13.0–17.0)
MCH: 28.8 pg (ref 26.0–34.0)
MCHC: 33.5 g/dL (ref 30.0–36.0)
MCV: 86 fL (ref 80.0–100.0)
Platelets: 135 10*3/uL — ABNORMAL LOW (ref 150–400)
RBC: 4.86 MIL/uL (ref 4.22–5.81)
RDW: 13.5 % (ref 11.5–15.5)
WBC: 13.2 10*3/uL — ABNORMAL HIGH (ref 4.0–10.5)
nRBC: 0 % (ref 0.0–0.2)

## 2019-05-22 LAB — MRSA PCR SCREENING: MRSA by PCR: NEGATIVE

## 2019-05-22 LAB — LIPASE, BLOOD: Lipase: 87 U/L — ABNORMAL HIGH (ref 11–51)

## 2019-05-22 MED ORDER — SODIUM CHLORIDE 0.9 % IV SOLN
2.0000 g | Freq: Three times a day (TID) | INTRAVENOUS | Status: DC
  Administered 2019-05-22 – 2019-05-24 (×6): 2 g via INTRAVENOUS
  Filled 2019-05-22 (×8): qty 2

## 2019-05-22 MED ORDER — VANCOMYCIN HCL IN DEXTROSE 1-5 GM/200ML-% IV SOLN
1000.0000 mg | Freq: Three times a day (TID) | INTRAVENOUS | Status: DC
  Filled 2019-05-22 (×2): qty 200

## 2019-05-22 MED ORDER — VANCOMYCIN HCL 10 G IV SOLR
2500.0000 mg | Freq: Once | INTRAVENOUS | Status: AC
  Administered 2019-05-22: 2500 mg via INTRAVENOUS
  Filled 2019-05-22: qty 2500

## 2019-05-22 MED ORDER — ALUM & MAG HYDROXIDE-SIMETH 200-200-20 MG/5ML PO SUSP: 30.0000 mL | Freq: Four times a day (QID) | ORAL | Status: DC | PRN

## 2019-05-22 NOTE — Progress Notes (Signed)
Pharmacy Antibiotic Note  Steve Hunt is a 43 y.o. male admitted on 05/19/2019 with pancreatitis/ possible pneumonia.  Pharmacy has been consulted for Cefepime and Vancomycin dosing.  F/u MRSA PCR  Plan: -Cefepime 2 gm IV q8h  - Vancomycin 2500mg  IV x 1 as Loading dose. Wt= 108.9 kg Will then continue with Vancomycin 1 gram IV q8h  Goal AUC 400-550. Expected AUC: 518 Cmin 15.2 SCr used: 0.92   Height: 6\' 2"  (188 cm) Weight: 240 lb (108.9 kg) IBW/kg (Calculated) : 82.2  Temp (24hrs), Avg:99.9 F (37.7 C), Min:99.3 F (37.4 C), Max:100.6 F (38.1 C)  Recent Labs  Lab 05/19/19 1935 05/20/19 0519 05/21/19 0436 05/22/19 0321  WBC 12.4* 11.4* 12.7* 13.2*  CREATININE 1.04 0.98 0.99 0.92    Estimated Creatinine Clearance: 138.8 mL/min (by C-G formula based on SCr of 0.92 mg/dL).    No Known Allergies  Antimicrobials this admission: Cefepime 9/10 >>   Vanc  9/10 >>    Dose adjustments this admission:    Microbiology results:   BCx:     UCx:      Sputum:    9/10 MRSA PCR: pending  Thank you for allowing pharmacy to be a part of this patient's care.  Riley Papin A 05/22/2019 12:27 PM

## 2019-05-22 NOTE — Progress Notes (Addendum)
[place] at [place] NAME: Steve Hunt    MR#:  [id number]  DATE OF BIRTH:  05-Nov-1975  SUBJECTIVE:  CHIEF COMPLAINT: Patient is spiking fever since yesterday evening, tolerating diet, feels bloated but denies any nausea and vomiting   REVIEW OF SYSTEMS:  CONSTITUTIONAL: No fever, fatigue or weakness.  EYES: No blurred or double vision.  EARS, NOSE, AND THROAT: No tinnitus or ear pain.  RESPIRATORY: Intermittent episodes of coughing, denies wheezing or hemoptysis.  CARDIOVASCULAR: No chest pain, orthopnea, edema.  GASTROINTESTINAL: Reporting epigastric abdominal pain with nausea GENITOURINARY: No dysuria, hematuria.  ENDOCRINE: No polyuria, nocturia,  HEMATOLOGY: No anemia, easy bruising or bleeding SKIN: No rash or lesion. MUSCULOSKELETAL: No joint pain or arthritis.   NEUROLOGIC: No tingling, numbness, weakness.  PSYCHIATRY: No anxiety or depression.   DRUG ALLERGIES:  No Known Allergies  VITALS:  Blood pressure (!) 137/96, pulse 82, temperature 99.3 F (37.4 C), temperature source Oral, resp. rate 18, height 6' 2" (1.88 m), weight 108.9 kg, SpO2 100 %.  PHYSICAL EXAMINATION:  GENERAL:  43 y.o.-year-old patient lying in the bed with no acute distress.  EYES: Pupils equal, round, reactive to light and accommodation. No scleral icterus. Extraocular muscles intact.  HEENT: Head atraumatic, normocephalic. Oropharynx and nasopharynx clear.  NECK:  Supple, no jugular venous distention. No thyroid enlargement, no tenderness.  LUNGS: Diminished breath sounds at bilateral bases , no wheezing, rales,rhonchi or crepitation. No use of accessory muscles of respiration.  CARDIOVASCULAR: S1, S2 normal. No murmurs, rubs, or gallops.  ABDOMEN: Soft, epigastric tenderness is present no rebound tenderness bowel sounds present.  Cholecystectomy scar is present  eXTREMITIES: No pedal edema, cyanosis, or clubbing.  NEUROLOGIC: Cranial  nerves II through XII are intact. Muscle strength 5/5 in all extremities. Sensation intact. Gait not checked.  PSYCHIATRIC: The patient is alert and oriented x 3.  SKIN: No obvious rash, lesion, or ulcer.    LABORATORY PANEL:   CBC Recent Labs  Lab 05/22/19 0321  WBC 13.2*  HGB 14.0  HCT 41.8  PLT 135*   ------------------------------------------------------------------------------------------------------------------  Chemistries  Recent Labs  Lab 05/22/19 0321  NA 137  K 3.8  CL 105  CO2 26  GLUCOSE 119*  BUN 11  CREATININE 0.92  CALCIUM 7.9*  AST 28  ALT 67*  ALKPHOS 77  BILITOT 2.3*   ------------------------------------------------------------------------------------------------------------------  Cardiac Enzymes No results for input(s): TROPONINI in the last 168 hours. ------------------------------------------------------------------------------------------------------------------  RADIOLOGY:  Dg Chest 2 View  Result Date: 05/22/2019 CLINICAL DATA:  Fever. EXAM: CHEST - 2 VIEW COMPARISON:  None. FINDINGS: Heart size and mediastinal contours are within normal limits. Streaky bibasilar opacities, atelectasis versus pneumonia. No pleural effusions seen. No pneumothorax seen. Osseous structures about the chest are unremarkable. IMPRESSION: Streaky bibasilar opacities, atelectasis versus pneumonia. Electronically Signed   By: Franki Cabot M.D.   On: 05/22/2019 10:50    EKG:   Orders placed or performed during the hospital encounter of 05/19/19  . ED EKG  . ED EKG  . EKG 12-Lead  . EKG 12-Lead    ASSESSMENT AND PLAN:  #Sepsis not present at admission from Healthcare associated pneumonia Patient met septic criteria with leukocytosis and fever Started patient on IV cefepime and vancomycin sepsis IV fluids A.m. labs Incentive spirometry  #Acute epigastric abdominal pain secondary to acute pancreatitis-from alcohol abuse Advance diet as tolerated Status  post cholecystectomy Pain management as needed Lipase level [phone] COVID negative  #External  iliac inguinal node-reactive probably Continue close monitoring PCP to follow-up  #Obesity Lifestyle modifications advised  #Alcohol abuse Outpatient alcohol Anonymous CIWA   GI prophylaxis with Protonix and DVT prophylaxis with Lovenox subcu  All the records are reviewed and case discussed with Care Management/Social Workerr. Management plans discussed with the patient, family and they are in agreement.  CODE STATUS: Full code  TOTAL TIME TAKING CARE OF THIS PATIENT: 53mnutes.   POSSIBLE D/C IN 1-DAYS, DEPENDING ON CLINICAL CONDITION.  Note: This dictation was prepared with Dragon dictation along with smaller phrase technology. Any transcriptional errors that result from this process are unintentional.   ANicholes MangoM.D on 05/22/2019 at 3:16 PM  Between 7am to 6pm - Pager - [phone] 6pm go to [email or web address] - password [email or web address]  Office  [phone] CC: Primary care physician; Patient, No Pcp Per

## 2019-05-23 LAB — COMPREHENSIVE METABOLIC PANEL
ALT: 56 U/L — ABNORMAL HIGH (ref 0–44)
AST: 28 U/L (ref 15–41)
Albumin: 3.1 g/dL — ABNORMAL LOW (ref 3.5–5.0)
Alkaline Phosphatase: 88 U/L (ref 38–126)
Anion gap: 5 (ref 5–15)
BUN: 10 mg/dL (ref 6–20)
CO2: 27 mmol/L (ref 22–32)
Calcium: 8 mg/dL — ABNORMAL LOW (ref 8.9–10.3)
Chloride: 105 mmol/L (ref 98–111)
Creatinine, Ser: 0.82 mg/dL (ref 0.61–1.24)
GFR calc Af Amer: >60 mL/min (ref >60)
GFR calc non Af Amer: >60 mL/min (ref >60)
Glucose, Bld: 110 mg/dL — ABNORMAL HIGH (ref 70–99)
Potassium: 3.8 mmol/L (ref 3.5–5.1)
Sodium: 137 mmol/L (ref 135–145)
Total Bilirubin: 2 mg/dL — ABNORMAL HIGH (ref 0.3–1.2)
Total Protein: 6.2 g/dL — ABNORMAL LOW (ref 6.5–8.1)

## 2019-05-23 LAB — CBC
HCT: 38.4 % — ABNORMAL LOW (ref 39.0–52.0)
Hemoglobin: 12.9 g/dL — ABNORMAL LOW (ref 13.0–17.0)
MCH: 28.9 pg (ref 26.0–34.0)
MCHC: 33.6 g/dL (ref 30.0–36.0)
MCV: 85.9 fL (ref 80.0–100.0)
Platelets: 145 10*3/uL — ABNORMAL LOW (ref 150–400)
RBC: 4.47 MIL/uL (ref 4.22–5.81)
RDW: 13.2 % (ref 11.5–15.5)
WBC: 10.3 10*3/uL (ref 4.0–10.5)
nRBC: 0 % (ref 0.0–0.2)

## 2019-05-23 LAB — LIPASE, BLOOD: Lipase: 55 U/L — ABNORMAL HIGH (ref 11–51)

## 2019-05-23 NOTE — Progress Notes (Signed)
Pharmacy Antibiotic Note  Steve Hunt Steve Hunt is a 43 y.o. male admitted on 05/19/2019 with pancreatitis/ possible pneumonia.  Pharmacy has been consulted for cefepime dosing. This is day #2 of IV antibiotics, leukocytosis has resolved, renal function is now at what appears to be his baseline, O2 sats are 95-100% on room air. However he was spiking fevers and vancomycin was initiated yesterday but MRSA PCR was negative so it was discontinued.   Plan: Continue cefepime 2 gm IV q8h   Height: 6\' 2"  (188 cm) Weight: 240 lb (108.9 kg) IBW/kg (Calculated) : 82.2  Temp (24hrs), Avg:99.4 F (37.4 C), Min:99.3 F (37.4 C), Max:99.6 F (37.6 C)  Recent Labs  Lab 05/19/19 1935 05/20/19 0519 05/21/19 0436 05/22/19 0321 05/23/19 0356  WBC 12.4* 11.4* 12.7* 13.2* 10.3  CREATININE 1.04 0.98 0.99 0.92 0.82    Estimated Creatinine Clearance: 155.8 mL/min (by C-G formula based on SCr of 0.82 mg/dL).    No Known Allergies  Antimicrobials this admission: Cefepime 9/10 >>   Vanc  9/10 x1   Microbiology results:  9/7  SARS-CoV-2: negative    9/10 MRSA PCR: negative  Thank you for allowing pharmacy to be a part of this patient's care.  Steve Hunt, PharmD 05/23/2019 7:48 AM

## 2019-05-23 NOTE — Progress Notes (Signed)
[place] at [place] NAME: Steve Hunt    MR#:  [id number]  DATE OF BIRTH:  1976/01/09  SUBJECTIVE:  CHIEF COMPLAINT: Patient is spiking fever , tolerating diet, some dry cough  REVIEW OF SYSTEMS:  CONSTITUTIONAL: No fever, fatigue or weakness.  EYES: No blurred or double vision.  EARS, NOSE, AND THROAT: No tinnitus or ear pain.  RESPIRATORY: Intermittent episodes of coughing, denies wheezing or hemoptysis.  CARDIOVASCULAR: No chest pain, orthopnea, edema.  GASTROINTESTINAL: Reporting epigastric abdominal pain with nausea GENITOURINARY: No dysuria, hematuria.  ENDOCRINE: No polyuria, nocturia,  HEMATOLOGY: No anemia, easy bruising or bleeding SKIN: No rash or lesion. MUSCULOSKELETAL: No joint pain or arthritis.   NEUROLOGIC: No tingling, numbness, weakness.  PSYCHIATRY: No anxiety or depression.   DRUG ALLERGIES:  No Known Allergies  VITALS:  Blood pressure 127/88, pulse 81, temperature 98.9 F (37.2 C), temperature source Oral, resp. rate 20, height '6\' 2"'  (1.88 m), weight 108.9 kg, SpO2 98 %.  PHYSICAL EXAMINATION:  GENERAL:  43 y.o.-year-old patient lying in the bed with no acute distress.  EYES: Pupils equal, round, reactive to light and accommodation. No scleral icterus. Extraocular muscles intact.  HEENT: Head atraumatic, normocephalic. Oropharynx and nasopharynx clear.  NECK:  Supple, no jugular venous distention. No thyroid enlargement, no tenderness.  LUNGS: Diminished breath sounds at bilateral bases , no wheezing, rales,rhonchi or crepitation. No use of accessory muscles of respiration.  CARDIOVASCULAR: S1, S2 normal. No murmurs, rubs, or gallops.  ABDOMEN: Soft, epigastric tenderness is present no rebound tenderness bowel sounds present.  Cholecystectomy scar is present  eXTREMITIES: No pedal edema, cyanosis, or clubbing.  NEUROLOGIC: Cranial nerves II through XII are intact. Muscle strength 5/5 in all  extremities. Sensation intact. Gait not checked.  PSYCHIATRIC: The patient is alert and oriented x 3.  SKIN: No obvious rash, lesion, or ulcer.    LABORATORY PANEL:   CBC Recent Labs  Lab 05/23/19 0356  WBC 10.3  HGB 12.9*  HCT 38.4*  PLT 145*   ------------------------------------------------------------------------------------------------------------------  Chemistries  Recent Labs  Lab 05/23/19 0356  NA 137  K 3.8  CL 105  CO2 27  GLUCOSE 110*  BUN 10  CREATININE 0.82  CALCIUM 8.0*  AST 28  ALT 56*  ALKPHOS 88  BILITOT 2.0*   ------------------------------------------------------------------------------------------------------------------  Cardiac Enzymes No results for input(s): TROPONINI in the last 168 hours. ------------------------------------------------------------------------------------------------------------------  RADIOLOGY:  Dg Chest 2 View  Result Date: 05/22/2019 CLINICAL DATA:  Fever. EXAM: CHEST - 2 VIEW COMPARISON:  None. FINDINGS: Heart size and mediastinal contours are within normal limits. Streaky bibasilar opacities, atelectasis versus pneumonia. No pleural effusions seen. No pneumothorax seen. Osseous structures about the chest are unremarkable. IMPRESSION: Streaky bibasilar opacities, atelectasis versus pneumonia. Electronically Signed   By: Franki Cabot M.D.   On: 05/22/2019 10:50    EKG:   Orders placed or performed during the hospital encounter of 05/19/19  . ED EKG  . ED EKG  . EKG 12-Lead  . EKG 12-Lead    ASSESSMENT AND PLAN:  #Sepsis not present at admission from Healthcare associated pneumonia Patient met septic criteria with leukocytosis and fever Continue IV cefepime and vancomycin sepsis Hemodynamically stable will discharge patient if he is afebrile for 24 hours IV fluidsA.m. labs Incentive spirometry  #Acute epigastric abdominal pain secondary to acute pancreatitis-from alcohol abuse Advance diet as  tolerated Status post cholecystectomy Pain management as needed Lipase level [phone] COVID negative  #  External iliac inguinal node-reactive probably Continue close monitoring PCP to follow-up  #Obesity Lifestyle modifications advised  #Alcohol abuse Outpatient alcohol Anonymous CIWA   GI prophylaxis with Protonix and DVT prophylaxis with Lovenox subcu  All the records are reviewed and case discussed with Care Management/Social Workerr. Management plans discussed with the patient, family and they are in agreement.  CODE STATUS: Full code  TOTAL TIME TAKING CARE OF THIS PATIENT: 75mnutes.   POSSIBLE D/C IN 1-DAYS, DEPENDING ON CLINICAL CONDITION.  Note: This dictation was prepared with Dragon dictation along with smaller phrase technology. Any transcriptional errors that result from this process are unintentional.   ANicholes MangoM.D on 05/23/2019 at 4:28 PM  Between 7am to 6pm - Pager - [phone] 6pm go to [email or web address] - password [email or web address]  Office  [phone] CC: Primary care physician; Patient, No Pcp Per

## 2019-05-24 MED ORDER — OXYCODONE HCL 5 MG PO TABS: 5.0000 mg | ORAL_TABLET | Freq: Four times a day (QID) | ORAL | 0 refills | Status: AC | PRN

## 2019-05-24 NOTE — Clinical Social Work Note (Signed)
CSW provided purple book to assist him with finding a PCP for people without insurance.  CSW to sign off.  Jones Broom. Norval Morton, MSW, [place]  05/24/2019 12:48 PM

## 2019-05-24 NOTE — Progress Notes (Signed)
05/24/2019 .Steve Hunt to be D/C'd Home per MD order.  Discussed prescriptions and follow up appointments with the patient. Prescriptions given to patient, medication list explained in detail. Pt verbalized understanding.  Allergies as of 05/24/2019   No Known Allergies     Medication List    STOP taking these medications   ibuprofen 800 MG tablet Commonly known as: ADVIL     TAKE these medications   oxyCODONE 5 MG immediate release tablet Commonly known as: Oxy IR/ROXICODONE Take 1 tablet (5 mg total) by mouth every 6 (six) hours as needed for severe pain or breakthrough pain. Notes to patient: Take as needed after 2:15pm.       Vitals:   05/23/19 1923 05/24/19 0412  BP: 137/85 117/78  Pulse: 89 73  Resp: 20 18  Temp: 98.5 F (36.9 C) 98.1 F (36.7 C)  SpO2: 97% 95%    Skin clean, dry and intact without evidence of skin break down, no evidence of skin tears noted. IV catheter discontinued intact. Site without signs and symptoms of complications. Dressing and pressure applied. Pt denies pain at this time. No complaints noted.  An After Visit Summary was printed and given to the patient. Patient escorted via [place], and D/C home via private auto.  Steve Hunt

## 2019-05-24 NOTE — Discharge Summary (Signed)
[facility] [place] [facility]   PATIENT NAME: Steve Hunt    MR#:  [id number]  DATE OF BIRTH:  12/28/1975  DATE OF ADMISSION:  05/19/2019 ADMITTING PHYSICIAN: Oralia Manis, MD  DATE OF DISCHARGE: 05/24/2019  PRIMARY CARE PHYSICIAN: Patient, No Pcp Per    ADMISSION DIAGNOSIS:  Acute pancreatitis, unspecified complication status, unspecified pancreatitis type [K85.90]  DISCHARGE DIAGNOSIS:  acute pancreatitis-- alcohol induced  SECONDARY DIAGNOSIS:   Past Medical History:  Diagnosis Date  . Gallstones     HOSPITAL COURSE:  Steve Hunt  is a 43 y.o. male who presents to the ED with a complaint of acute onset abdominal pain radiating to his back.  It is in his epigastric region.  #Acute epigastric abdominal pain secondary to acute pancreatitis-from alcohol abuse Advance diet as tolerated Status post cholecystectomy in the past Pain management as needed Lipase level [phone] COVID negative patient able to tolerate regular diet. Will give PRN pain meds. Patient advised strongly on alcohol cessation. He voiced understanding  #Low-grade fever with mild leukocytosis. Patient was started on empiric antibiotic for possible pneumonia. Patient does not exhibit any signs symptoms of pneumonia. He is afebrile. No cough. Fever and leukocytosis could very well be due to pancreatitis. I don't see any indication for continuing antibiotics which will be discontinued. Discussed this with patient.  #Obesity Lifestyle modifications advised  #Alcohol abuse Outpatient alcohol Anonymous  Overall hemodynamically stable. Discharge to home. Patient will follow-up with urgent care as needed  CONSULTS OBTAINED:    DRUG ALLERGIES:  No Known Allergies  DISCHARGE MEDICATIONS:   Allergies as of 05/24/2019   No Known Allergies     Medication List    STOP taking these medications   ibuprofen 800 MG tablet Commonly known as:  ADVIL     TAKE these medications   oxyCODONE 5 MG immediate release tablet Commonly known as: Oxy IR/ROXICODONE Take 1 tablet (5 mg total) by mouth every 6 (six) hours as needed for severe pain or breakthrough pain. Notes to patient: Take as needed after 2:15pm.       If you experience worsening of your admission symptoms, develop shortness of breath, life threatening emergency, suicidal or homicidal thoughts you must seek medical attention immediately by calling 911 or calling your MD immediately  if symptoms less severe.  You Must read complete instructions/literature along with all the possible adverse reactions/side effects for all the Medicines you take and that have been prescribed to you. Take any new Medicines after you have completely understood and accept all the possible adverse reactions/side effects.   Please note  You were cared for by a hospitalist during your hospital stay. If you have any questions about your discharge medications or the care you received while you were in the hospital after you are discharged, you can call the unit and asked to speak with the hospitalist on call if the hospitalist that took care of you is not available. Once you are discharged, your primary care physician will handle any further medical issues. Please note that NO REFILLS for any discharge medications will be authorized once you are discharged, as it is imperative that you return to your primary care physician (or establish a relationship with a primary care physician if you do not have one) for your aftercare needs so that they can reassess your need for medications and monitor your lab values. Today   SUBJECTIVE   Overall doing well. No cough no fever.  Tolerating PO diet.  VITAL SIGNS:  Blood pressure 117/78, pulse 73, temperature 98.1 F (36.7 C), temperature source Oral, resp. rate 18, height 6\' 2"  (1.88 m), weight 108.9 kg, SpO2 95 %.  I/O:    Intake/Output Summary (Last 24  hours) at 05/24/2019 1057 Last data filed at 05/24/2019 0954 Gross per 24 hour  Intake 1543 ml  Output 2675 ml  Net -1132 ml    PHYSICAL EXAMINATION:  GENERAL:  43 y.o.-year-old patient lying in the bed with no acute distress. Obese EYES: Pupils equal, round, reactive to light and accommodation. No scleral icterus. Extraocular muscles intact.  HEENT: Head atraumatic, normocephalic. Oropharynx and nasopharynx clear.  NECK:  Supple, no jugular venous distention. No thyroid enlargement, no tenderness.  LUNGS: Normal breath sounds bilaterally, no wheezing, rales,rhonchi or crepitation. No use of accessory muscles of respiration.  CARDIOVASCULAR: S1, S2 normal. No murmurs, rubs, or gallops.  ABDOMEN: Soft, non-tender, non-distended. Bowel sounds present. No organomegaly or mass.  EXTREMITIES: No pedal edema, cyanosis, or clubbing.  NEUROLOGIC: Cranial nerves II through XII are intact. Muscle strength 5/5 in all extremities. Sensation intact. Gait not checked.  PSYCHIATRIC: The patient is alert and oriented x 3.  SKIN: No obvious rash, lesion, or ulcer.   DATA REVIEW:   CBC  Recent Labs  Lab 05/23/19 0356  WBC 10.3  HGB 12.9*  HCT 38.4*  PLT 145*    Chemistries  Recent Labs  Lab 05/23/19 0356  NA 137  K 3.8  CL 105  CO2 27  GLUCOSE 110*  BUN 10  CREATININE 0.82  CALCIUM 8.0*  AST 28  ALT 56*  ALKPHOS 88  BILITOT 2.0*    Microbiology Results   Recent Results (from the past 240 hour(s))  SARS CORONAVIRUS 2 (TAT 6-24 HRS) Nasopharyngeal Nasopharyngeal Swab     Status: None   Collection Time: 05/19/19 10:20 PM   Specimen: Nasopharyngeal Swab  Result Value Ref Range Status   SARS Coronavirus 2 NEGATIVE NEGATIVE Final    Comment: (NOTE) SARS-CoV-2 target nucleic acids are NOT DETECTED. The SARS-CoV-2 RNA is generally detectable in upper and lower respiratory specimens during the acute phase of infection. Negative results do not preclude SARS-CoV-2 infection, do not  rule out co-infections with other pathogens, and should not be used as the sole basis for treatment or other patient management decisions. Negative results must be combined with clinical observations, patient history, and epidemiological information. The expected result is Negative. Fact Sheet for Patients: HairSlick.no Fact Sheet for Healthcare Providers: quierodirigir.com This test is not yet approved or cleared by the [place] FDA and  has been authorized for detection and/or diagnosis of SARS-CoV-2 by FDA under an Emergency Use Authorization (EUA). This EUA will remain  in effect (meaning this test can be used) for the duration of the COVID-19 declaration under Section 56 4(b)(1) of the Act, 21 U.S.C. section 360bbb-3(b)(1), unless the authorization is terminated or revoked sooner. Performed at [facility], [address], [place], [place] [address]   MRSA PCR Screening     Status: None   Collection Time: 05/22/19 11:48 AM   Specimen: Nasopharyngeal  Result Value Ref Range Status   MRSA by PCR NEGATIVE NEGATIVE Final    Comment:        The GeneXpert MRSA Assay (FDA approved for NASAL specimens only), is one component of a comprehensive MRSA colonization surveillance program. It is not intended to diagnose MRSA infection nor to guide or monitor treatment for MRSA  infections. Performed at [facility], [address], [place], [place] [address]     RADIOLOGY:  No results found.   CODE STATUS:     Code Status Orders  (From admission, onward)         Start     Ordered   05/20/19 0323  Full code  Continuous     05/20/19 0322        Code Status History    Date Active Date Inactive Code Status Order ID Comments User Context   03/31/2019 1057 04/01/2019 1850 Full Code [id number]  Sung Amabile, DO Inpatient   Advance Care Planning Activity      TOTAL TIME TAKING CARE OF THIS PATIENT:  *40* minutes.    Enedina Finner M.D on 05/24/2019 at 10:57 AM  Between 7am to 6pm - Pager - [phone] After 6pm go to [email or web address] - password [email or web address]  [facility]  Office  [phone]  CC: Primary care physician; Patient, No Pcp Per

## 2019-05-24 NOTE — Discharge Instructions (Signed)
Acute Pancreatitis  Acute pancreatitis happens when the pancreas gets swollen. The pancreas is a large gland in the body that helps to control blood sugar. It also makes enzymes that help to digest food. This condition can last a few days and cause serious problems. The lungs, heart, and kidneys may stop working. What are the causes? Causes include:  Alcohol abuse.  Drug abuse.  Gallstones.  A tumor in the pancreas. Other causes include:  Some medicines.  Some chemicals.  Diabetes.  An infection.  Damage caused by an accident.  The poison (venom) from a scorpion bite.  Belly (abdominal) surgery.  The body's defense system (immune system) attacking the pancreas (autoimmune pancreatitis).  Genes that are passed from parent to child (inherited). In some cases, the cause is not known. What are the signs or symptoms?  Pain in the upper belly that may be felt in the back. The pain may be very bad.  Swelling of the belly.  Feeling sick to your stomach (nauseous) and throwing up (vomiting).  Fever. How is this treated? You will likely have to stay in the hospital. Treatment may include:  Pain medicine.  Fluid through an IV tube.  Placing a tube in the stomach to take out the stomach contents. This may help you stop throwing up.  Not eating for 3-4 days.  Antibiotic medicines, if you have an infection.  Treating any other problems that may be the cause.  Steroid medicines, if your problem is caused by your defense system attacking your body's own tissues.  Surgery. Follow these instructions at home: Eating and drinking   Follow instructions from your doctor about what to eat and drink.  Eat foods that do not have a lot of fat in them.  Eat small meals often. Do not eat big meals.  Drink enough fluid to keep your pee (urine) pale yellow.  Do not drink alcohol if it caused your condition. Medicines  Take over-the-counter and prescription medicines  only as told by your doctor.  Ask your doctor if the medicine prescribed to you: ? Requires you to avoid driving or using heavy machinery. ? Can cause trouble pooping (constipation). You may need to take steps to prevent or treat trouble pooping:  Take over-the-counter or prescription medicines.  Eat foods that are high in fiber. These include beans, whole grains, and fresh fruits and vegetables.  Limit foods that are high in fat and sugar. These include fried or sweet foods. General instructions  Do not use any products that contain nicotine or tobacco, such as cigarettes, e-cigarettes, and chewing tobacco. If you need help quitting, ask your doctor.  Get plenty of rest.  Check your blood sugar at home as told by your doctor.  Keep all follow-up visits as told by your doctor. This is important. Contact a doctor if:  You do not get better as quickly as expected.  You have new symptoms.  Your symptoms get worse.  You have pain or weakness that lasts a long time.  You keep feeling sick to your stomach.  You get better and then you have pain again.  You have a fever. Get help right away if:  You cannot eat or keep fluids down.  Your pain gets very bad.  Your skin or the white part of your eyes turns yellow.  You have sudden swelling in your belly.  You throw up.  You feel dizzy or you pass out (faint).  Your blood sugar is high (over  300 mg/dL). Summary  Acute pancreatitis happens when the pancreas gets swollen.  This condition is often caused by alcohol abuse, drug abuse, or gallstones.  You will likely have to stay in the hospital for treatment. This information is not intended to replace advice given to you by your health care provider. Make sure you discuss any questions you have with your health care provider. Document Released: 02/14/2008 Document Revised: 06/17/2018 Document Reviewed: 06/17/2018 Elsevier Patient Education  2020 Elsevier  Inc.  Pancreatitis Eating Plan Pancreatitis is when your pancreas becomes irritated and swollen (inflamed). The pancreas is a small organ located behind your stomach. It helps your body digest food and regulate your blood sugar. Pancreatitis can affect how your body digests food, especially foods with fat. You may also have other symptoms such as abdominal pain or nausea. When you have pancreatitis, following a low-fat eating plan may help you manage symptoms and recover more quickly. Work with your health care provider or a diet and nutrition specialist (dietitian) to create an eating plan that is right for you. What are tips for following this plan? Reading food labels Use the information on food labels to help keep track of how much fat you eat:  Check the serving size.  Look for the amount of total fat in grams (g) in one serving. ? Low-fat foods have 3 g of fat or less per serving. ? Fat-free foods have 0.5 g of fat or less per serving.  Keep track of how much fat you eat based on how many servings you eat. ? For example, if you eat two servings, the amount of fat you eat will be two times what is listed on the label. Shopping   Buy low-fat or nonfat foods, such as: ? Fresh, frozen, or canned fruits and vegetables. ? Grains, including pasta, bread, and rice. ? Lean meat, poultry, fish, and other protein foods. ? Low-fat or nonfat dairy.  Avoid buying bakery products and other sweets made with whole milk, butter, and eggs.  Avoid buying snack foods with added fat, such as anything with butter or cheese flavoring. Cooking  Remove skin from poultry, and remove extra fat from meat.  Limit the amount of fat and oil you use to 6 teaspoons or less per day.  Cook using low-fat methods, such as boiling, broiling, grilling, steaming, or baking.  Use spray oil to cook. Add fat-free chicken broth to add flavor and moisture.  Avoid adding cream to thicken soups or sauces. Use other  thickeners such as corn starch or tomato paste. Meal planning   Eat a low-fat diet as told by your dietitian. For most people, this means having no more than 55-65 grams of fat each day.  Eat small, frequent meals throughout the day. For example, you may have 5-6 small meals instead of 3 large meals.  Drink enough fluid to keep your urine pale yellow.  Do not drink alcohol. Talk to your health care provider if you need help stopping.  Limit how much caffeine you have, including black coffee, black and green tea, caffeinated soft drinks, and energy drinks. General information  Let your health care provider or dietitian know if you have unplanned weight loss on this eating plan.  You may be instructed to follow a clear liquid diet during a flare of symptoms. Talk with your health care provider about how to manage your diet during symptoms of a flare.  Take any vitamins or supplements as told by your health care  provider.  Work with a Microbiologist, especially if you have other conditions such as obesity or diabetes mellitus. What foods should I avoid? Fruits Fried fruits. Fruits served with butter or cream. Vegetables Fried vegetables. Vegetables cooked with butter, cheese, or cream. Grains Biscuits, waffles, donuts, pastries, and croissants. Pies and cookies. Butter-flavored popcorn. Regular crackers. Meats and other protein foods Fatty cuts of meat. Poultry with skin. Organ meats. Bacon, sausage, and cold cuts. Whole eggs. Nuts and nut butters. Dairy Whole and 2% milk. Whole milk yogurt. Whole milk ice cream. Cream and half-and-half. Cream cheese. Sour cream. Cheese. Beverages Wine, beer, and liquor. The items listed above may not be a complete list of foods and beverages to avoid. Contact a dietitian for more information. Summary  Pancreatitis can affect how your body digests food, especially foods with fat.  When you have pancreatitis, it is recommended that you follow a low-fat  eating plan to help you recover more quickly and manage symptoms. For most people, this means limiting fat to no more than 55-65 grams per day.  Do not drink alcohol. Limit the amount of caffeine you have, and drink enough fluid to keep your urine pale yellow. This information is not intended to replace advice given to you by your health care provider. Make sure you discuss any questions you have with your health care provider. Document Released: 12/04/2017 Document Revised: 12/19/2018 Document Reviewed: 12/04/2017 Elsevier Patient Education  2020 [employer].  Stop drinking alcohol!!

## 2021-08-11 IMAGING — CR DG CHEST 2V
2 series · 2 of 2 positions shown · non-contrast
Comparison: None.

CLINICAL DATA: Fever.

EXAM:
CHEST - 2 VIEW

[chest pa]
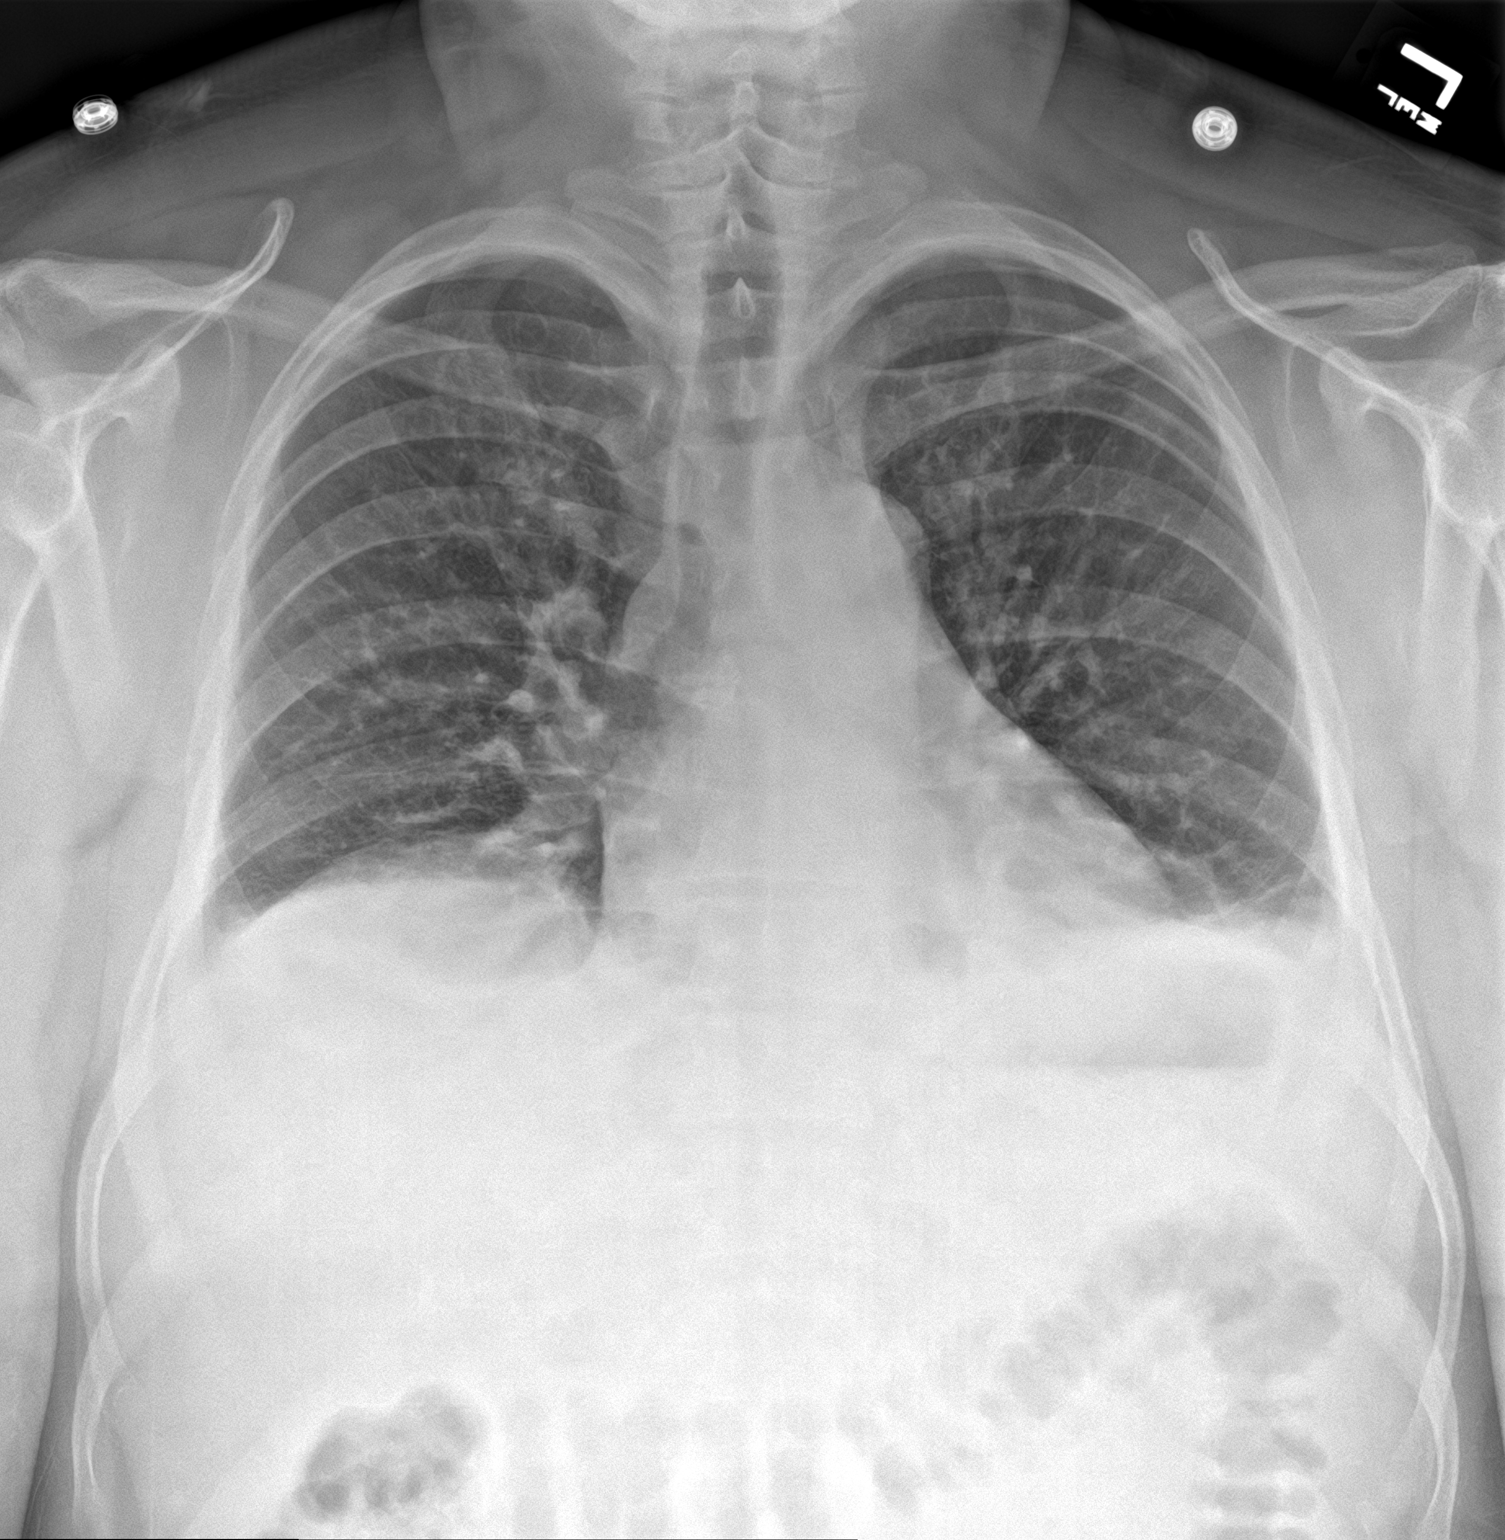

[chest lat]
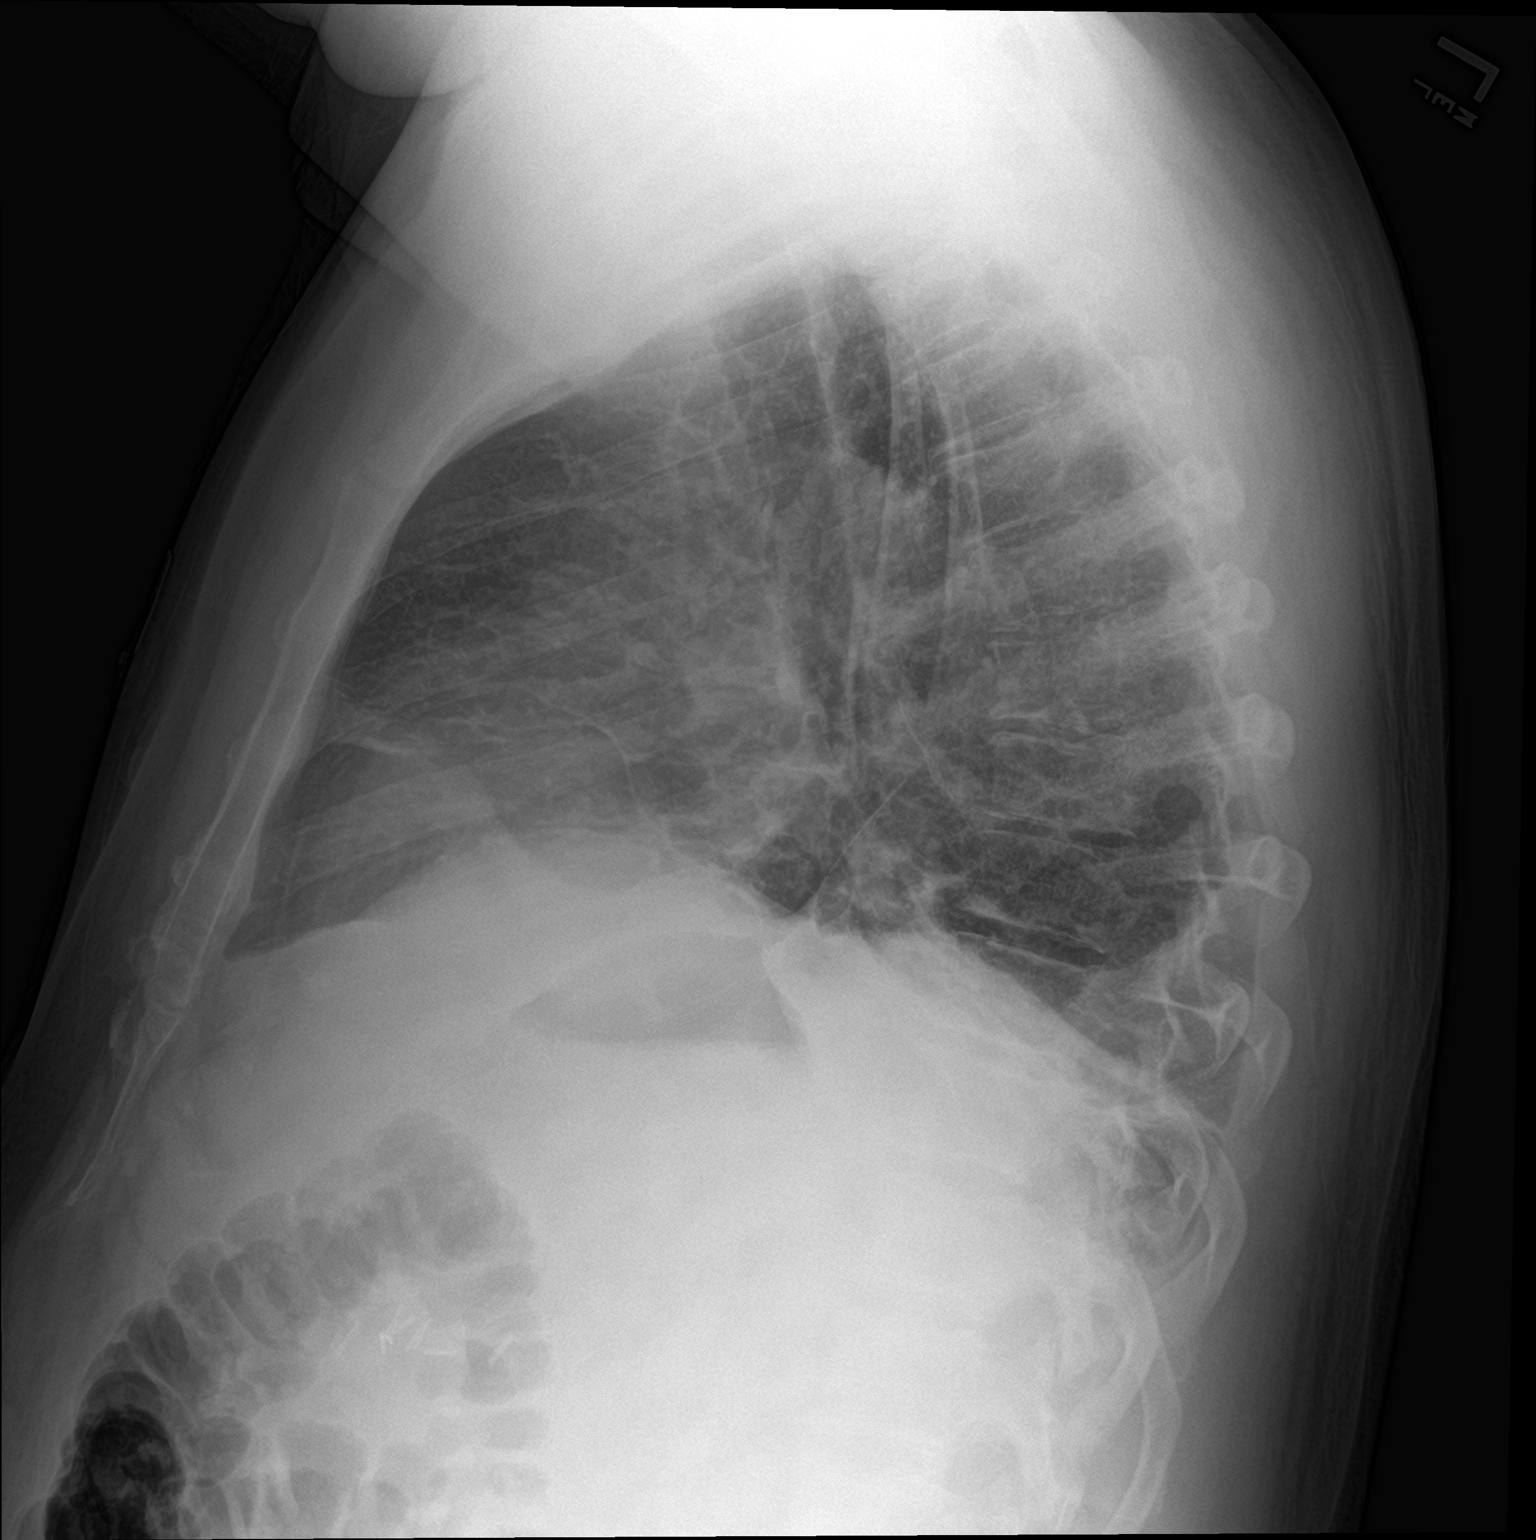

[2 of 2 positions shown; findings below may reference images not displayed]

FINDINGS: Heart size and mediastinal contours are within normal limits.
Streaky bibasilar opacities, atelectasis versus pneumonia. No
pleural effusions seen. No pneumothorax seen. Osseous structures
about the chest are unremarkable.
IMPRESSION: Streaky bibasilar opacities, atelectasis versus pneumonia.

## 2022-12-19 ENCOUNTER — Ambulatory Visit
Admission: RE | Admit: 2022-12-19 | Discharge: 2022-12-19 | Disposition: A | Discharge status: Home / Self Care | Payer: Self-pay | Attending: Family | Admitting: Family

## 2022-12-19 ENCOUNTER — Other Ambulatory Visit: Payer: Self-pay | Admitting: Family

## 2022-12-19 ENCOUNTER — Ambulatory Visit
Admission: RE | Admit: 2022-12-19 | Discharge: 2022-12-19 | Disposition: A | Discharge status: Home / Self Care | Payer: Self-pay | Source: Ambulatory Visit | Attending: Family | Admitting: Family

## 2022-12-19 DIAGNOSIS — M25511 Pain in right shoulder: Secondary | ICD-10-CM | POA: Insufficient documentation

## 2022-12-19 DIAGNOSIS — W11XXXA Fall on and from ladder, initial encounter: Secondary | ICD-10-CM | POA: Insufficient documentation
